# Patient Record
Sex: Female | Born: 1980 | Race: White | Hispanic: No | Marital: Married | Smoking: Never smoker
Health system: Southern US, Community
[De-identification: ages and names within clinical notes are randomized; demographics above are authoritative.]

## PROBLEM LIST (undated history)

## (undated) ENCOUNTER — Inpatient Hospital Stay (HOSPITAL_COMMUNITY): Payer: Self-pay

## (undated) DIAGNOSIS — D649 Anemia, unspecified: Secondary | ICD-10-CM

## (undated) DIAGNOSIS — D219 Benign neoplasm of connective and other soft tissue, unspecified: Secondary | ICD-10-CM

## (undated) DIAGNOSIS — Z8619 Personal history of other infectious and parasitic diseases: Secondary | ICD-10-CM

## (undated) DIAGNOSIS — R51 Headache: Secondary | ICD-10-CM

## (undated) DIAGNOSIS — R55 Syncope and collapse: Secondary | ICD-10-CM

## (undated) DIAGNOSIS — O039 Complete or unspecified spontaneous abortion without complication: Secondary | ICD-10-CM

## (undated) HISTORY — PX: ARTHROSCOPIC REPAIR ACL: SUR80

## (undated) HISTORY — DX: Anemia, unspecified: D64.9

## (undated) HISTORY — DX: Complete or unspecified spontaneous abortion without complication: O03.9

## (undated) HISTORY — DX: Headache: R51

## (undated) HISTORY — DX: Personal history of other infectious and parasitic diseases: Z86.19

## (undated) HISTORY — DX: Syncope and collapse: R55

## (undated) HISTORY — DX: Benign neoplasm of connective and other soft tissue, unspecified: D21.9

---
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
## 2003-12-08 HISTORY — PX: HAMMER TOE SURGERY: SHX385

## 2006-03-15 ENCOUNTER — Other Ambulatory Visit: Admission: RE | Admit: 2006-03-15 | Discharge: 2006-03-15 | Payer: Self-pay | Admitting: Obstetrics and Gynecology

## 2007-12-08 HISTORY — PX: KNEE SURGERY: SHX244

## 2007-12-08 HISTORY — PX: OTHER SURGICAL HISTORY: SHX169

## 2008-01-31 ENCOUNTER — Encounter: Admission: RE | Admit: 2008-01-31 | Discharge: 2008-01-31 | Payer: Self-pay | Admitting: Orthopedic Surgery

## 2008-02-23 ENCOUNTER — Encounter: Admission: RE | Admit: 2008-02-23 | Discharge: 2008-02-23 | Payer: Self-pay | Admitting: Orthopedic Surgery

## 2008-12-12 ENCOUNTER — Ambulatory Visit: Admission: AD | Admit: 2008-12-12 | Discharge: 2008-12-12 | Payer: Self-pay | Admitting: Obstetrics and Gynecology

## 2008-12-12 ENCOUNTER — Encounter (INDEPENDENT_AMBULATORY_CARE_PROVIDER_SITE_OTHER): Payer: Self-pay | Admitting: Obstetrics and Gynecology

## 2009-11-19 ENCOUNTER — Emergency Department (HOSPITAL_COMMUNITY): Admission: EM | Admit: 2009-11-19 | Discharge: 2009-11-19 | Payer: Self-pay | Admitting: Emergency Medicine

## 2010-10-16 ENCOUNTER — Inpatient Hospital Stay (HOSPITAL_COMMUNITY): Admission: AD | Admit: 2010-10-16 | Discharge: 2010-10-17 | Payer: Self-pay | Admitting: Obstetrics and Gynecology

## 2011-03-23 LAB — CBC
HCT: 23.8 % — ABNORMAL LOW (ref 36.0–46.0)
MCV: 58.4 fL — ABNORMAL LOW (ref 78.0–100.0)
Platelets: 381 10*3/uL (ref 150–400)
RDW: 20.4 % — ABNORMAL HIGH (ref 11.5–15.5)

## 2011-03-31 ENCOUNTER — Inpatient Hospital Stay (HOSPITAL_COMMUNITY)
Admission: AD | Admit: 2011-03-31 | Discharge: 2011-03-31 | Disposition: A | Discharge status: Home / Self Care | Payer: BC Managed Care – PPO | Source: Ambulatory Visit | Attending: Obstetrics and Gynecology | Admitting: Obstetrics and Gynecology

## 2011-03-31 DIAGNOSIS — O479 False labor, unspecified: Secondary | ICD-10-CM | POA: Insufficient documentation

## 2011-04-05 ENCOUNTER — Inpatient Hospital Stay (HOSPITAL_COMMUNITY): Admission: AD | Admit: 2011-04-05 | Payer: Self-pay | Admitting: *Deleted

## 2011-04-10 ENCOUNTER — Inpatient Hospital Stay (HOSPITAL_COMMUNITY)
Admission: RE | Admit: 2011-04-10 | Discharge: 2011-04-12 | DRG: 373 | Disposition: A | Discharge status: Home / Self Care | Payer: BC Managed Care – PPO | Source: Ambulatory Visit | Attending: Obstetrics and Gynecology | Admitting: Obstetrics and Gynecology

## 2011-04-10 DIAGNOSIS — O480 Post-term pregnancy: Principal | ICD-10-CM | POA: Diagnosis present

## 2011-04-10 LAB — CBC
MCHC: 33.9 g/dL (ref 30.0–36.0)
Platelets: 209 10*3/uL (ref 150–400)
RDW: 13.9 % (ref 11.5–15.5)
WBC: 7.8 10*3/uL (ref 4.0–10.5)

## 2011-04-11 LAB — CBC
Platelets: 218 10*3/uL (ref 150–400)
RDW: 13.9 % (ref 11.5–15.5)
WBC: 16.6 10*3/uL — ABNORMAL HIGH (ref 4.0–10.5)

## 2011-04-11 LAB — RPR: RPR Ser Ql: NONREACTIVE

## 2011-04-21 NOTE — Op Note (Signed)
Loretta Bates, Loretta Bates                ACCOUNT NO.:  [id number]   MEDICAL RECORD NO.:  [id number]          PATIENT TYPE:  AMB   LOCATION:  [facility]                          FACILITY:  [facility]   PHYSICIAN:  Michelle L. Grewal, M.D.DATE OF BIRTH:  1981/04/19   DATE OF PROCEDURE:  12/12/2008  DATE OF DISCHARGE:                               OPERATIVE REPORT   PREOPERATIVE DIAGNOSES:  1. Prolapsing fibroids.  2. Menorrhagia.  3. Anemia.   POSTOPERATIVE DIAGNOSES:  1. Prolapsing fibroids.  2. Menorrhagia.  3. Anemia.   PROCEDURE:  1. Removal of prolapsing myoma.  2. Uterine curettage.   SURGEON:  Michelle L. Vincente Poli, MD   ANESTHESIA:  Spinal.   FINDINGS:  A 4-cm prolapsing fibroid sent to Pathology.   ESTIMATED BLOOD LOSS:  Minimal.   COMPLICATIONS:  None.   PROCEDURE:  The patient was taken to the operating room.  Her spinal was  placed without incident.  She was prepped and draped in usual sterile  fashion.  Speculum was inserted to the vagina and a 4-cm fibroid was  noted prolapsing into the vagina.  I grasped it with a tenaculum it  twisted about 5 times and easily came off along with the stalk, it was  sent to Pathology.  I then inserted a sharp uterine curette since the  cervix was already dilated and thoroughly curetted the uterine cavity  and moderate amount of tissue.  I could not palpate any other fibroids  within the uterine cavity.  I then sent that to Pathology as well,  labeled the uterine curettings.  The vagina was cleaned out.  There was  no heavy bleeding noted.  All her instruments removed from the vagina.  The patient tolerated procedure well and she went to recovery room in  stable condition.     Michelle L. Vincente Poli, M.D.  Electronically Signed    MLG/MEDQ  D:  12/12/2008  T:  12/13/2008  Job:  [id number]

## 2011-12-08 DIAGNOSIS — O039 Complete or unspecified spontaneous abortion without complication: Secondary | ICD-10-CM

## 2011-12-08 HISTORY — DX: Complete or unspecified spontaneous abortion without complication: O03.9

## 2012-06-02 ENCOUNTER — Other Ambulatory Visit: Payer: Self-pay | Admitting: Gastroenterology

## 2012-06-02 DIAGNOSIS — R112 Nausea with vomiting, unspecified: Secondary | ICD-10-CM

## 2012-06-03 ENCOUNTER — Other Ambulatory Visit: Payer: BC Managed Care – PPO

## 2012-06-07 ENCOUNTER — Ambulatory Visit
Admission: RE | Admit: 2012-06-07 | Discharge: 2012-06-07 | Disposition: A | Discharge status: Home / Self Care | Payer: BC Managed Care – PPO | Source: Ambulatory Visit | Attending: Gastroenterology | Admitting: Gastroenterology

## 2012-06-07 DIAGNOSIS — R112 Nausea with vomiting, unspecified: Secondary | ICD-10-CM

## 2013-02-23 LAB — OB RESULTS CONSOLE HEPATITIS B SURFACE ANTIGEN: Hepatitis B Surface Ag: NEGATIVE

## 2013-02-23 LAB — OB RESULTS CONSOLE ANTIBODY SCREEN: Antibody Screen: NEGATIVE

## 2013-02-23 LAB — OB RESULTS CONSOLE GC/CHLAMYDIA
Chlamydia: NEGATIVE
Gonorrhea: NEGATIVE

## 2013-08-25 IMAGING — US US ABDOMEN COMPLETE
1 series · 14 of 25 positions shown · non-contrast
Comparison: None.

CLINICAL DATA: Nausea and vomiting

COMPLETE ABDOMINAL ULTRASOUND

[Series 1: us abdomen complete · 0.24mm/px · 14 of 81 slices shown]
[im 1/81]
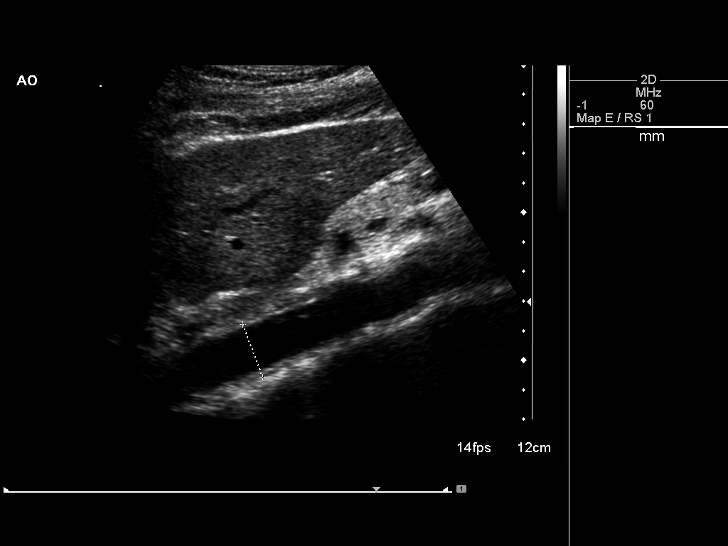
[im 7/81]
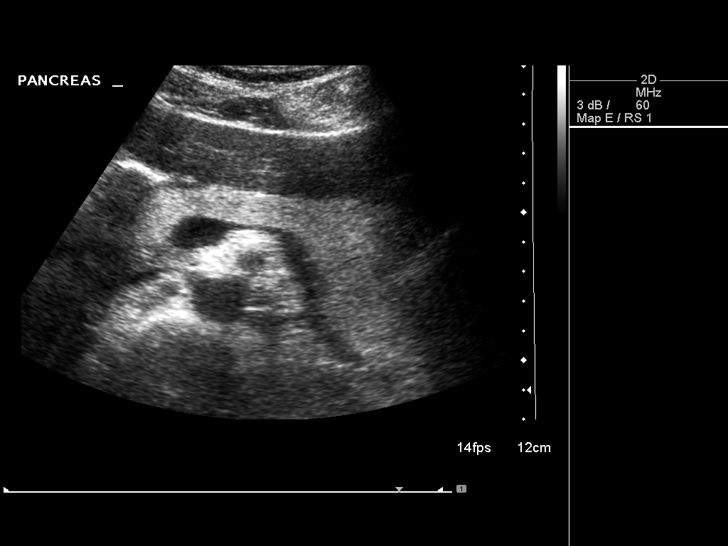
[im 14/81]
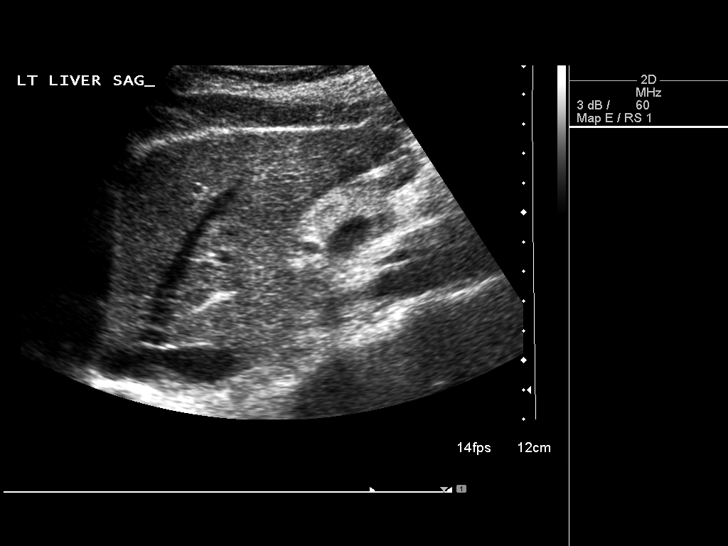
[im 21/81]
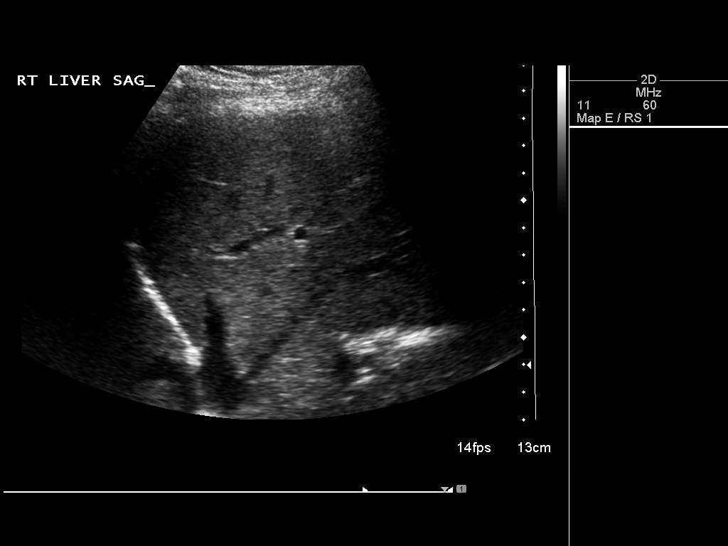
[im 27/81]
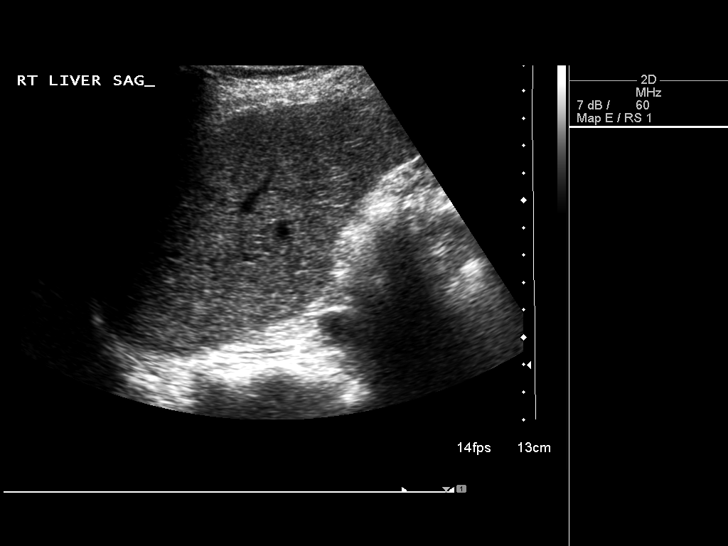
[im 31/81]
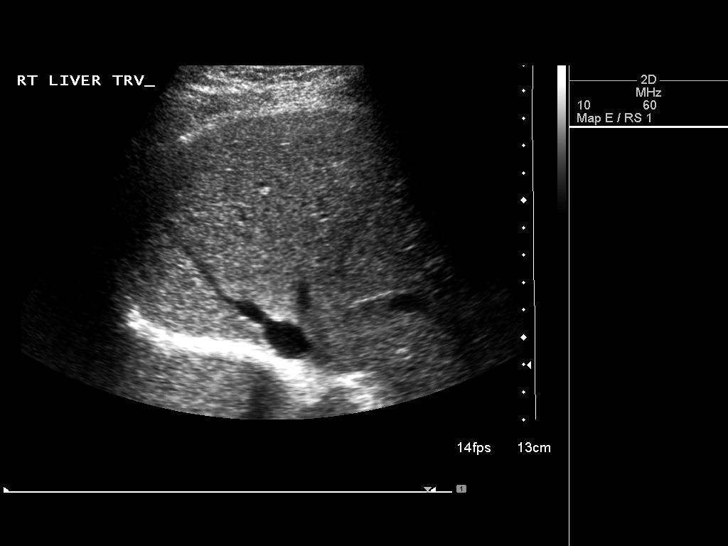
[im 37/81]
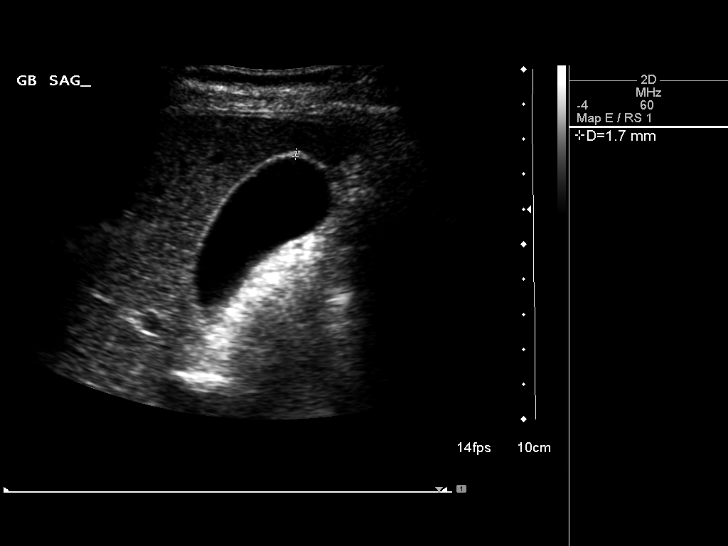
[im 44/81]
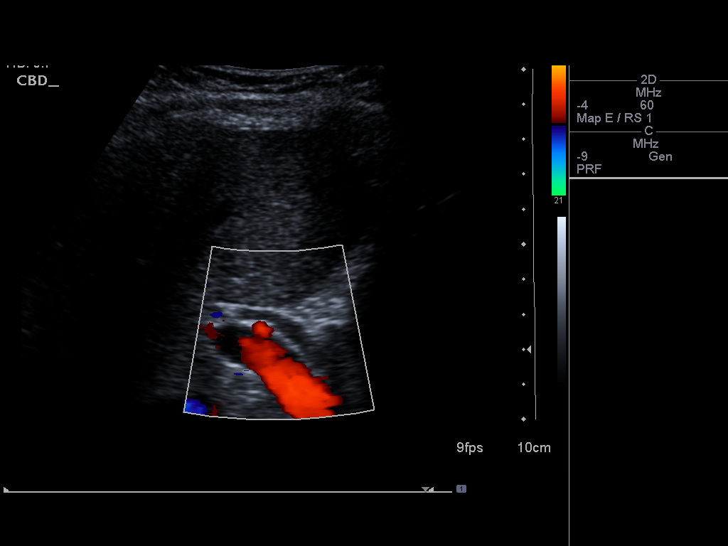
[im 51/81]
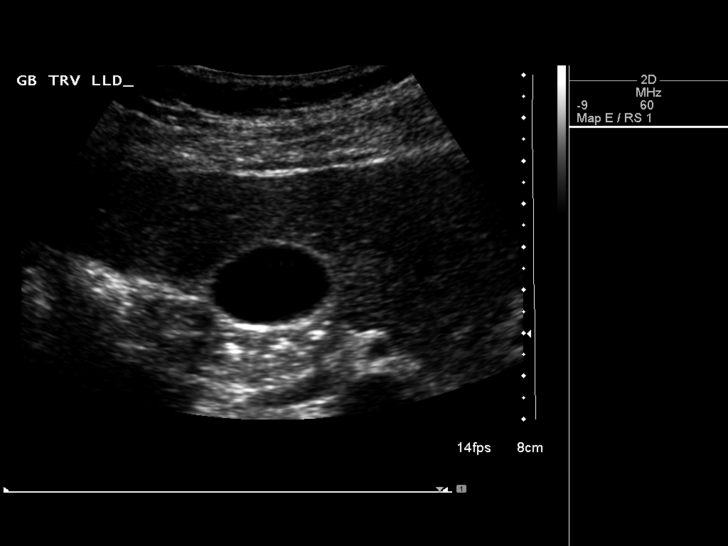
[im 54/81]
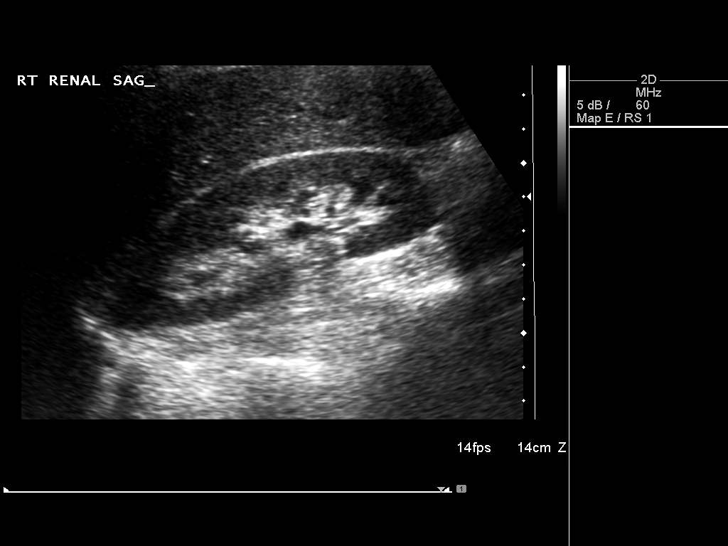
[im 61/81]
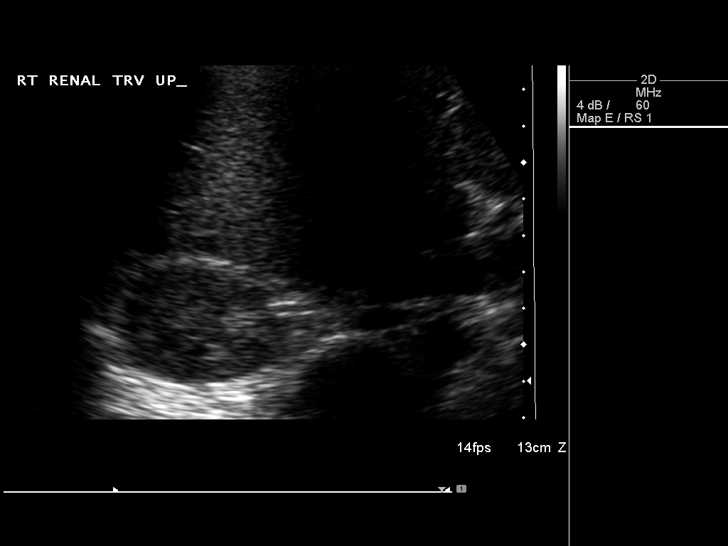
[im 67/81]
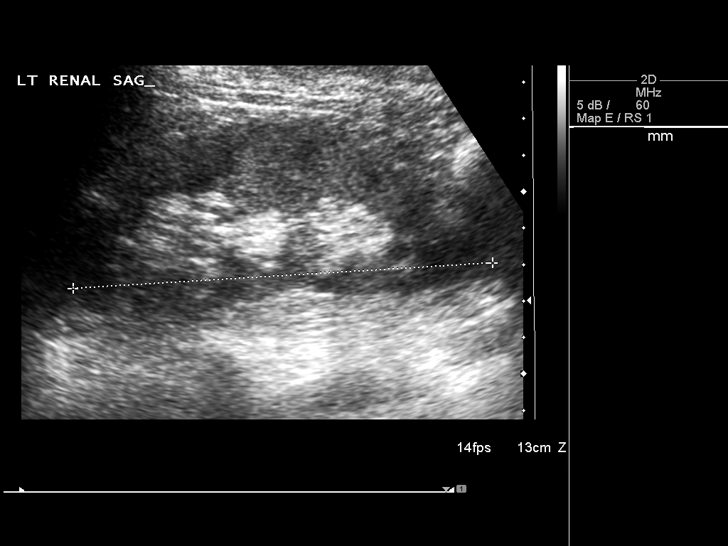
[im 74/81]
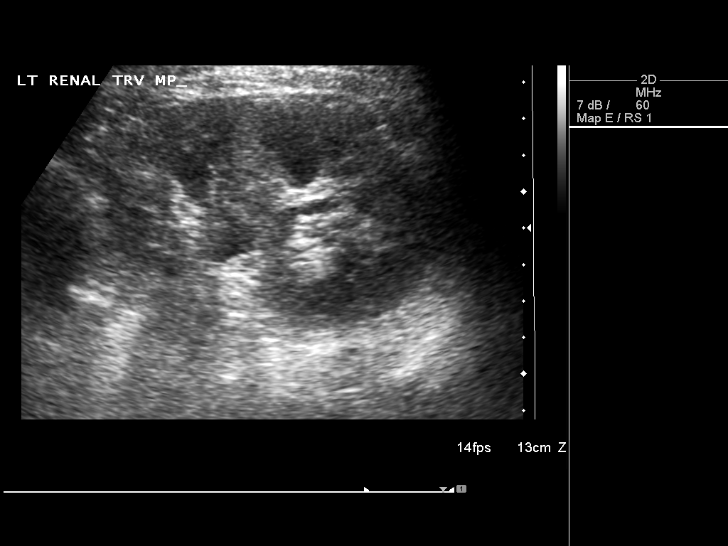
[im 81/81]
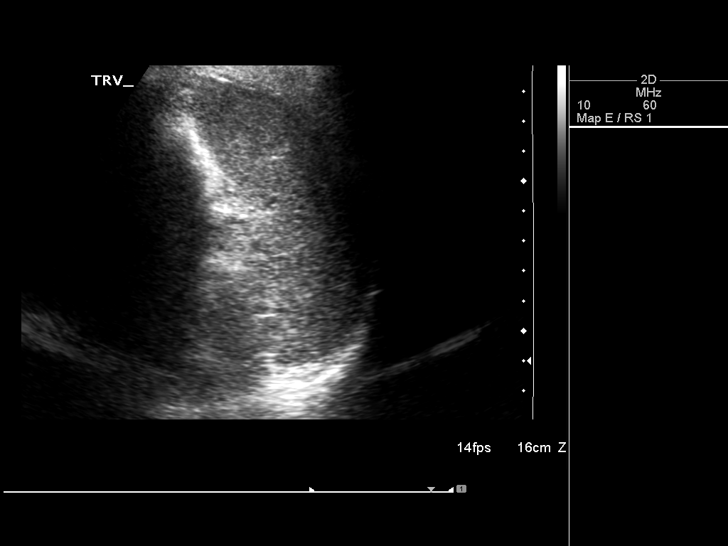

[14 of 25 positions shown; findings below may reference images not displayed]

FINDINGS: Gallbladder:  The gallbladder is visualized and no gallstones are
noted.  There is no pain over the gallbladder with compression.

Common bile duct:  The common bile duct does measure up to 7 mm in
diameter distally which is within upper limits of normal.
Correlation with liver function tests is recommended.

Liver:  The liver has a normal echogenic pattern.  No ductal
dilatation is seen.

IVC:  The IVC is partially obscured by bowel gas.

Pancreas:  No focal abnormality seen.

Spleen:  The spleen is normal measuring 8.7 cm sagittally.

Right Kidney:  No hydronephrosis is seen.  The right kidney
measures 11.0 cm sagittally.

Left Kidney:  No hydronephrosis is seen.  The left kidney measures
11.5 cm.

Abdominal aorta:  The abdominal aorta is normal in caliber.
IMPRESSION: 1.  No gallstones.
2.  Common bile duct is within upper limits of normal for age.
Correlate with LFTs.

## 2013-09-11 LAB — OB RESULTS CONSOLE GBS: GBS: POSITIVE

## 2013-09-28 ENCOUNTER — Telehealth (HOSPITAL_COMMUNITY): Payer: Self-pay | Admitting: *Deleted

## 2013-09-28 ENCOUNTER — Encounter (HOSPITAL_COMMUNITY): Payer: Self-pay | Admitting: *Deleted

## 2013-09-28 NOTE — Telephone Encounter (Signed)
Preadmission screen  

## 2013-10-05 ENCOUNTER — Inpatient Hospital Stay (HOSPITAL_COMMUNITY): Admission: AD | Admit: 2013-10-05 | Payer: Self-pay | Source: Ambulatory Visit | Admitting: Obstetrics and Gynecology

## 2013-10-09 ENCOUNTER — Inpatient Hospital Stay (HOSPITAL_COMMUNITY): Payer: BC Managed Care – PPO

## 2013-10-09 ENCOUNTER — Inpatient Hospital Stay (HOSPITAL_COMMUNITY)
Admission: RE | Admit: 2013-10-09 | Discharge: 2013-10-12 | DRG: 775 | Disposition: A | Discharge status: Home / Self Care | Payer: BC Managed Care – PPO | Source: Ambulatory Visit | Attending: Obstetrics and Gynecology | Admitting: Obstetrics and Gynecology

## 2013-10-09 ENCOUNTER — Encounter (HOSPITAL_COMMUNITY): Payer: Self-pay

## 2013-10-09 DIAGNOSIS — O480 Post-term pregnancy: Principal | ICD-10-CM | POA: Diagnosis present

## 2013-10-09 DIAGNOSIS — Z22330 Carrier of Group B streptococcus: Secondary | ICD-10-CM

## 2013-10-09 DIAGNOSIS — O99892 Other specified diseases and conditions complicating childbirth: Secondary | ICD-10-CM | POA: Diagnosis present

## 2013-10-09 LAB — TYPE AND SCREEN: ABO/RH(D): B POS

## 2013-10-09 LAB — CBC
HCT: 37.6 % (ref 36.0–46.0)
MCV: 83.4 fL (ref 78.0–100.0)
RBC: 4.51 MIL/uL (ref 3.87–5.11)
RDW: 13.1 % (ref 11.5–15.5)
WBC: 7.6 10*3/uL (ref 4.0–10.5)

## 2013-10-09 MED ORDER — ACETAMINOPHEN 325 MG PO TABS
650.0000 mg | ORAL_TABLET | ORAL | Status: DC | PRN
  Administered 2013-10-09 – 2013-10-10 (×2): 650 mg via ORAL
  Filled 2013-10-09 (×2): qty 2

## 2013-10-09 MED ORDER — IBUPROFEN 600 MG PO TABS: 600.0000 mg | ORAL_TABLET | Freq: Four times a day (QID) | ORAL | Status: DC | PRN

## 2013-10-09 MED ORDER — OXYCODONE-ACETAMINOPHEN 5-325 MG PO TABS: 1.0000 | ORAL_TABLET | ORAL | Status: DC | PRN

## 2013-10-09 MED ORDER — PENICILLIN G POTASSIUM 5000000 UNITS IJ SOLR
5.0000 10*6.[IU] | Freq: Once | INTRAVENOUS | Status: AC
  Administered 2013-10-10: 5 10*6.[IU] via INTRAVENOUS
  Filled 2013-10-09: qty 5

## 2013-10-09 MED ORDER — BUTORPHANOL TARTRATE 1 MG/ML IJ SOLN: 1.0000 mg | INTRAMUSCULAR | Status: DC | PRN

## 2013-10-09 MED ORDER — FLEET ENEMA 7-19 GM/118ML RE ENEM: 1.0000 | ENEMA | Freq: Every day | RECTAL | Status: DC | PRN

## 2013-10-09 MED ORDER — OXYTOCIN 40 UNITS IN LACTATED RINGERS INFUSION - SIMPLE MED: 62.5000 mL/h | INTRAVENOUS | Status: DC

## 2013-10-09 MED ORDER — ONDANSETRON HCL 4 MG/2ML IJ SOLN: 4.0000 mg | Freq: Four times a day (QID) | INTRAMUSCULAR | Status: DC | PRN

## 2013-10-09 MED ORDER — PENICILLIN G POTASSIUM 5000000 UNITS IJ SOLR
2.5000 10*6.[IU] | INTRAVENOUS | Status: DC
  Administered 2013-10-10: 2.5 10*6.[IU] via INTRAVENOUS
  Filled 2013-10-09 (×5): qty 2.5

## 2013-10-09 MED ORDER — LIDOCAINE HCL (PF) 1 % IJ SOLN
30.0000 mL | INTRAMUSCULAR | Status: DC | PRN
  Administered 2013-10-10: 30 mL via SUBCUTANEOUS
  Filled 2013-10-09 (×2): qty 30

## 2013-10-09 MED ORDER — TERBUTALINE SULFATE 1 MG/ML IJ SOLN: 0.2500 mg | Freq: Once | INTRAMUSCULAR | Status: AC | PRN

## 2013-10-09 MED ORDER — LACTATED RINGERS IV SOLN
INTRAVENOUS | Status: DC
  Administered 2013-10-09 – 2013-10-10 (×2): via INTRAVENOUS

## 2013-10-09 MED ORDER — OXYTOCIN 40 UNITS IN LACTATED RINGERS INFUSION - SIMPLE MED
1.0000 m[IU]/min | INTRAVENOUS | Status: DC
  Administered 2013-10-10: 2 m[IU]/min via INTRAVENOUS
  Filled 2013-10-09: qty 1000

## 2013-10-09 MED ORDER — LACTATED RINGERS IV SOLN: 500.0000 mL | INTRAVENOUS | Status: DC | PRN

## 2013-10-09 MED ORDER — MISOPROSTOL 25 MCG QUARTER TABLET
25.0000 ug | ORAL_TABLET | ORAL | Status: DC | PRN
  Administered 2013-10-09 – 2013-10-10 (×2): 25 ug via VAGINAL
  Filled 2013-10-09 (×2): qty 1
  Filled 2013-10-09: qty 0.25

## 2013-10-09 MED ORDER — ZOLPIDEM TARTRATE 5 MG PO TABS
5.0000 mg | ORAL_TABLET | Freq: Every evening | ORAL | Status: DC | PRN
  Administered 2013-10-09: 5 mg via ORAL
  Filled 2013-10-09: qty 1

## 2013-10-09 MED ORDER — OXYTOCIN BOLUS FROM INFUSION: 500.0000 mL | INTRAVENOUS | Status: DC

## 2013-10-09 MED ORDER — CITRIC ACID-SODIUM CITRATE 334-500 MG/5ML PO SOLN: 30.0000 mL | ORAL | Status: DC | PRN

## 2013-10-10 ENCOUNTER — Encounter (HOSPITAL_COMMUNITY): Payer: Self-pay

## 2013-10-10 LAB — RPR: RPR Ser Ql: NONREACTIVE

## 2013-10-10 MED ORDER — WITCH HAZEL-GLYCERIN EX PADS: 1.0000 "application " | MEDICATED_PAD | CUTANEOUS | Status: DC | PRN

## 2013-10-10 MED ORDER — MEASLES, MUMPS & RUBELLA VAC ~~LOC~~ INJ: 0.5000 mL | INJECTION | Freq: Once | SUBCUTANEOUS | Status: DC

## 2013-10-10 MED ORDER — SIMETHICONE 80 MG PO CHEW: 80.0000 mg | CHEWABLE_TABLET | ORAL | Status: DC | PRN

## 2013-10-10 MED ORDER — DIBUCAINE 1 % RE OINT: 1.0000 "application " | TOPICAL_OINTMENT | RECTAL | Status: DC | PRN

## 2013-10-10 MED ORDER — IBUPROFEN 600 MG PO TABS
600.0000 mg | ORAL_TABLET | Freq: Four times a day (QID) | ORAL | Status: DC
  Administered 2013-10-10 – 2013-10-12 (×7): 600 mg via ORAL
  Filled 2013-10-10 (×7): qty 1

## 2013-10-10 MED ORDER — BENZOCAINE-MENTHOL 20-0.5 % EX AERO
1.0000 "application " | INHALATION_SPRAY | CUTANEOUS | Status: DC | PRN
  Filled 2013-10-10: qty 56

## 2013-10-10 MED ORDER — DIPHENHYDRAMINE HCL 25 MG PO CAPS: 25.0000 mg | ORAL_CAPSULE | Freq: Four times a day (QID) | ORAL | Status: DC | PRN

## 2013-10-10 MED ORDER — MEDROXYPROGESTERONE ACETATE 150 MG/ML IM SUSP: 150.0000 mg | INTRAMUSCULAR | Status: DC | PRN

## 2013-10-10 MED ORDER — LANOLIN HYDROUS EX OINT: TOPICAL_OINTMENT | CUTANEOUS | Status: DC | PRN

## 2013-10-10 MED ORDER — EPHEDRINE 5 MG/ML INJ
10.0000 mg | INTRAVENOUS | Status: DC | PRN
  Filled 2013-10-10: qty 2

## 2013-10-10 MED ORDER — PRENATAL MULTIVITAMIN CH
1.0000 | ORAL_TABLET | Freq: Every day | ORAL | Status: DC
  Administered 2013-10-10 – 2013-10-11 (×2): 1 via ORAL
  Filled 2013-10-10 (×2): qty 1

## 2013-10-10 MED ORDER — LACTATED RINGERS IV SOLN: 500.0000 mL | Freq: Once | INTRAVENOUS | Status: DC

## 2013-10-10 MED ORDER — ONDANSETRON HCL 4 MG PO TABS: 4.0000 mg | ORAL_TABLET | ORAL | Status: DC | PRN

## 2013-10-10 MED ORDER — FENTANYL 2.5 MCG/ML BUPIVACAINE 1/10 % EPIDURAL INFUSION (WH - ANES): 14.0000 mL/h | INTRAMUSCULAR | Status: DC | PRN

## 2013-10-10 MED ORDER — PHENYLEPHRINE 40 MCG/ML (10ML) SYRINGE FOR IV PUSH (FOR BLOOD PRESSURE SUPPORT)
80.0000 ug | PREFILLED_SYRINGE | INTRAVENOUS | Status: DC | PRN
  Filled 2013-10-10: qty 2

## 2013-10-10 MED ORDER — DIPHENHYDRAMINE HCL 50 MG/ML IJ SOLN: 12.5000 mg | INTRAMUSCULAR | Status: DC | PRN

## 2013-10-10 MED ORDER — ONDANSETRON HCL 4 MG/2ML IJ SOLN: 4.0000 mg | INTRAMUSCULAR | Status: DC | PRN

## 2013-10-10 MED ORDER — TETANUS-DIPHTH-ACELL PERTUSSIS 5-2.5-18.5 LF-MCG/0.5 IM SUSP: 0.5000 mL | Freq: Once | INTRAMUSCULAR | Status: DC

## 2013-10-10 MED ORDER — SENNOSIDES-DOCUSATE SODIUM 8.6-50 MG PO TABS
2.0000 | ORAL_TABLET | ORAL | Status: DC
  Administered 2013-10-10 – 2013-10-12 (×2): 2 via ORAL
  Filled 2013-10-10 (×2): qty 2

## 2013-10-10 MED ORDER — OXYCODONE-ACETAMINOPHEN 5-325 MG PO TABS
1.0000 | ORAL_TABLET | ORAL | Status: DC | PRN
  Administered 2013-10-10 (×2): 2 via ORAL
  Administered 2013-10-11 – 2013-10-12 (×4): 1 via ORAL
  Administered 2013-10-12: 2 via ORAL
  Filled 2013-10-10: qty 2
  Filled 2013-10-10 (×2): qty 1
  Filled 2013-10-10 (×2): qty 2
  Filled 2013-10-10 (×2): qty 1

## 2013-10-10 NOTE — Progress Notes (Signed)
SVD of vigerous  female infant w/ apgars of 7,9.  Placenta delivered spontaneous w/ 3VC.   Lidocaine injected to perineum for local anesthesia 2nd degree lac repaired w/ 3-0 vicryl rapide.  Fundus firm.  EBL 475cc .

## 2013-10-10 NOTE — H&P (Signed)
Loretta Bates is a 32 y.o. female presenting for postdates IOL.  No regular ctx.  No vb or lof.  + FM.  Pregnancy uncomplicated.  History OB History   Grav Para Term Preterm Abortions TAB SAB Ect Mult Living   3 1 1  1  1   1      Past Medical History  Diagnosis Date  . Fibroid   . Hx of varicella   . Syncope     neurocardiac in HS and college, took meds no problems since then  . Anemia   . Headache(784.0)     hormonal migraines   Past Surgical History  Procedure Laterality Date  . Hammer toe surgery    . Arthroscopic repair acl    . Knee surgery    . Eye surgery    . Resection of prolapsed fribroid  2010   Family History: family history includes Cancer in her maternal grandfather, paternal grandfather, and paternal grandmother; Goiter in her mother and sister; Other in her father. Social History:  reports that she has never smoked. She has never used smokeless tobacco. She reports that she does not drink alcohol or use illicit drugs.   Prenatal Transfer Tool  Maternal Diabetes: No Genetic Screening: Normal Maternal Ultrasounds/Referrals: Normal Fetal Ultrasounds or other Referrals:  None Maternal Substance Abuse:  No Significant Maternal Medications:  None Significant Maternal Lab Results:  None Other Comments:  None  ROS  Dilation: 3 Effacement (%): 50 Station: -2 Exam by:: Sanela Evola Blood pressure 116/82, pulse 80, temperature 98.1 F (36.7 C), temperature source Oral, resp. rate 20, height 5\' 2"  (1.575 m), weight 75.297 kg (166 lb), last menstrual period 12/29/2012. Exam Physical Exam  Prenatal labs: ABO, Rh: --/--/B POS (11/03 2030) Antibody: NEG (11/03 2030) Rubella: Immune (03/20 0000) RPR: NON REACTIVE (11/03 2030)  HBsAg: Negative (03/20 0000)  HIV: Non-reactive (03/20 0000)  GBS: Positive (10/06 0000)   Assessment/Plan: Admit Pitocin Exp mngt Epidural prn GBS prophyla   Loretta Bates 10/10/2013, 8:20 AM

## 2013-10-11 LAB — CBC
HCT: 32.4 % — ABNORMAL LOW (ref 36.0–46.0)
Hemoglobin: 11.7 g/dL — ABNORMAL LOW (ref 12.0–15.0)
MCV: 84.4 fL (ref 78.0–100.0)
Platelets: 188 10*3/uL (ref 150–400)
RBC: 3.84 MIL/uL — ABNORMAL LOW (ref 3.87–5.11)
RDW: 13.4 % (ref 11.5–15.5)
WBC: 12.8 10*3/uL — ABNORMAL HIGH (ref 4.0–10.5)

## 2013-10-11 NOTE — Progress Notes (Signed)
Post Partum Day 1 Subjective: no complaints, up ad lib, voiding and tolerating PO  Objective: Blood pressure 93/65, pulse 73, temperature 97.6 F (36.4 C), temperature source Oral, resp. rate 18, height 5\' 2"  (1.575 m), weight 166 lb (75.297 kg), last menstrual period 12/29/2012, SpO2 97.00%, unknown if currently breastfeeding.  Physical Exam:  General: alert and cooperative Lochia: appropriate Uterine Fundus: firm Incision: perineum intact DVT Evaluation: No evidence of DVT seen on physical exam. Negative Homan's sign. No cords or calf tenderness. No significant calf/ankle edema.   Recent Labs  10/09/13 2030 10/11/13 0640  HGB 13.3 11.7*  HCT 37.6 32.4*    Assessment/Plan: Plan for discharge tomorrow   LOS: 2 days   CURTIS,CAROL G 10/11/2013, 8:12 AM

## 2013-10-12 MED ORDER — OXYCODONE-ACETAMINOPHEN 5-325 MG PO TABS: 1.0000 | ORAL_TABLET | ORAL | Status: DC | PRN

## 2013-10-12 MED ORDER — IBUPROFEN 600 MG PO TABS: 600.0000 mg | ORAL_TABLET | Freq: Four times a day (QID) | ORAL | Status: DC

## 2013-10-12 NOTE — Discharge Summary (Signed)
Obstetric Discharge Summary Reason for Admission: induction of labor Prenatal Procedures: ultrasound Intrapartum Procedures: spontaneous vaginal delivery Postpartum Procedures: none Complications-Operative and Postpartum: 2 degree perineal laceration Hemoglobin  Date Value Range Status  10/11/2013 11.7* 12.0 - 15.0 g/dL Final     HCT  Date Value Range Status  10/11/2013 32.4* 36.0 - 46.0 % Final    Physical Exam:  General: alert and cooperative Lochia: appropriate Uterine Fundus: firm Incision: perineum intact DVT Evaluation: No evidence of DVT seen on physical exam. Negative Homan's sign. No cords or calf tenderness. No significant calf/ankle edema.  Discharge Diagnoses: Term Pregnancy-delivered  Discharge Information: Date: 10/12/2013 Activity: pelvic rest Diet: routine Medications: PNV, Ibuprofen and Percocet Condition: stable Instructions: refer to practice specific booklet Discharge to: home   Newborn Data: Live born female  Birth Weight: 7 lb 6.3 oz (3355 g) APGAR: 8, 9  Home with mother.  Loretta Bates G 10/12/2013, 8:19 AM

## 2014-10-08 ENCOUNTER — Encounter (HOSPITAL_COMMUNITY): Payer: Self-pay

## 2014-12-07 NOTE — L&D Delivery Note (Signed)
SVD of VFI at 1733 on 12/06/15.  APGARs 8,9.  EBL 200cc.   Head delivered OA and body followed atraumatically.  Cord was clamped and cut.  Cord blood was obtained.  Placenta delivered S/I/3VC.  Fundus was firmed with pitocin and massage.  2nd degree lac was infiltrated with 20 cc 1% lidocaine with epi.  Using 3-0 Rapide, the 2nd degree was repaired in the normal fashion.  Mom and baby stable.  Linda Hedges, DO

## 2015-01-18 ENCOUNTER — Other Ambulatory Visit: Payer: Self-pay | Admitting: Obstetrics and Gynecology

## 2015-01-22 LAB — CYTOLOGY - PAP

## 2015-04-23 LAB — OB RESULTS CONSOLE ABO/RH: RH Type: POSITIVE

## 2015-04-23 LAB — OB RESULTS CONSOLE RPR: RPR: NONREACTIVE

## 2015-04-23 LAB — OB RESULTS CONSOLE HIV ANTIBODY (ROUTINE TESTING): HIV: NONREACTIVE

## 2015-04-23 LAB — OB RESULTS CONSOLE GC/CHLAMYDIA
Chlamydia: NEGATIVE
Gonorrhea: NEGATIVE

## 2015-04-23 LAB — OB RESULTS CONSOLE ANTIBODY SCREEN: Antibody Screen: NEGATIVE

## 2015-04-23 LAB — OB RESULTS CONSOLE RUBELLA ANTIBODY, IGM: RUBELLA: IMMUNE

## 2015-04-23 LAB — OB RESULTS CONSOLE HEPATITIS B SURFACE ANTIGEN: Hepatitis B Surface Ag: NEGATIVE

## 2015-06-03 ENCOUNTER — Inpatient Hospital Stay (HOSPITAL_COMMUNITY)
Admission: AD | Admit: 2015-06-03 | Discharge: 2015-06-03 | Disposition: A | Discharge status: Home / Self Care | Payer: BC Managed Care – PPO | Source: Ambulatory Visit | Attending: Obstetrics and Gynecology | Admitting: Obstetrics and Gynecology

## 2015-06-03 ENCOUNTER — Encounter (HOSPITAL_COMMUNITY): Payer: Self-pay

## 2015-06-03 DIAGNOSIS — G43019 Migraine without aura, intractable, without status migrainosus: Secondary | ICD-10-CM

## 2015-06-03 DIAGNOSIS — Z3A13 13 weeks gestation of pregnancy: Secondary | ICD-10-CM | POA: Insufficient documentation

## 2015-06-03 DIAGNOSIS — G43909 Migraine, unspecified, not intractable, without status migrainosus: Secondary | ICD-10-CM | POA: Insufficient documentation

## 2015-06-03 DIAGNOSIS — O9989 Other specified diseases and conditions complicating pregnancy, childbirth and the puerperium: Secondary | ICD-10-CM | POA: Diagnosis not present

## 2015-06-03 LAB — URINALYSIS, ROUTINE W REFLEX MICROSCOPIC
BILIRUBIN URINE: NEGATIVE
Glucose, UA: NEGATIVE mg/dL
HGB URINE DIPSTICK: NEGATIVE
Ketones, ur: NEGATIVE mg/dL
Leukocytes, UA: NEGATIVE
Nitrite: NEGATIVE
Protein, ur: NEGATIVE mg/dL
Specific Gravity, Urine: 1.015 (ref 1.005–1.030)
Urobilinogen, UA: 0.2 mg/dL (ref 0.0–1.0)
pH: 6.5 (ref 5.0–8.0)

## 2015-06-03 MED ORDER — DIPHENHYDRAMINE HCL 50 MG/ML IJ SOLN
25.0000 mg | Freq: Once | INTRAMUSCULAR | Status: AC
  Administered 2015-06-03: 25 mg via INTRAVENOUS
  Filled 2015-06-03: qty 1

## 2015-06-03 MED ORDER — PROMETHAZINE HCL 25 MG PO TABS: 25.0000 mg | ORAL_TABLET | Freq: Four times a day (QID) | ORAL | Status: DC | PRN

## 2015-06-03 MED ORDER — DEXAMETHASONE SODIUM PHOSPHATE 10 MG/ML IJ SOLN
10.0000 mg | Freq: Once | INTRAMUSCULAR | Status: AC
  Administered 2015-06-03: 10 mg via INTRAVENOUS
  Filled 2015-06-03: qty 1

## 2015-06-03 MED ORDER — LACTATED RINGERS IV BOLUS (SEPSIS)
1000.0000 mL | Freq: Once | INTRAVENOUS | Status: AC
  Administered 2015-06-03: 1000 mL via INTRAVENOUS

## 2015-06-03 MED ORDER — METOCLOPRAMIDE HCL 5 MG/ML IJ SOLN
10.0000 mg | Freq: Once | INTRAMUSCULAR | Status: AC
  Administered 2015-06-03: 10 mg via INTRAVENOUS
  Filled 2015-06-03: qty 2

## 2015-06-03 NOTE — Discharge Instructions (Signed)

## 2015-06-03 NOTE — MAU Note (Signed)
Pt presents complaining of a migraine. States she gets them in pregnancy but these are worse than before. States it started on Wednesday and went away but it came back. Was given rx of vicodin and states it helps "off and on" and she doesn't want to take too much. Feels nauseous from the pain and is not eating or drinking.

## 2015-06-03 NOTE — MAU Provider Note (Signed)
History     CSN: [id number]  Arrival date and time: 06/03/15 1153   None     Chief Complaint  Patient presents with  . Headache   HPI   [name] is a 34 y.o. female [phone] at [redacted]w[redacted]d presenting to MAU with complaints of a migraine. She has a history of migraines, however they were easily managed in the past. She had a piece of chocolate cake Wednesday evening and woke up Thursday morning with a migraine; she feels chocolate may have been a trigger, however chocolate has never been a trigger in the past. She was given Vicodin for her migraines in this pregnancy, and feels that it has helped off and on.   This is very similar to migraines she has had in the past with migraines and pregnancy.   OB History    Gravida Para Term Preterm AB TAB SAB Ectopic Multiple Living   4 2 2  1  1   2       Past Medical History  Diagnosis Date  . Fibroid   . Hx of varicella   . Syncope     neurocardiac in HS and college, took meds no problems since then  . Anemia   . Headache(784.0)     hormonal migraines    Past Surgical History  Procedure Laterality Date  . Hammer toe surgery    . Arthroscopic repair acl    . Knee surgery    . Eye surgery    . Resection of prolapsed fribroid  2010    Family History  Problem Relation Age of Onset  . Goiter Mother   . Other Father     WPW syndrome wolf parkinson white syndrome  . Goiter Sister   . Cancer Maternal Grandfather     merkel cell carcinoma  . Cancer Paternal Grandmother     melanoma  . Cancer Paternal Grandfather     colon, prostate    History  Substance Use Topics  . Smoking status: Never Smoker   . Smokeless tobacco: Never Used  . Alcohol Use: No    Allergies: No Known Allergies  Prescriptions prior to admission  Medication Sig Dispense Refill Last Dose  . acetaminophen (TYLENOL) 325 MG tablet Take 650 mg by mouth every 6 (six) hours as needed for pain (For headache.).   10/09/2013 at Unknown time  . ibuprofen  (ADVIL,MOTRIN) 600 MG tablet Take 1 tablet (600 mg total) by mouth every 6 (six) hours. 30 tablet 1   . oxyCODONE-acetaminophen (PERCOCET/ROXICET) 5-325 MG per tablet Take 1-2 tablets by mouth every 4 (four) hours as needed for severe pain (moderate - severe pain). 30 tablet 0   . Prenatal Vit-Fe Fumarate-FA (PRENATAL MULTIVITAMIN) TABS tablet Take 1 tablet by mouth daily at 12 noon.   10/09/2013 at Unknown time  . Probiotic Product (PROBIOTIC DAILY PO) Take 1 capsule by mouth daily.   10/09/2013 at Unknown time   Results for orders placed or performed during the hospital encounter of 06/03/15 (from the past 48 hour(s))  Urinalysis, Routine w reflex microscopic (not at [facility])     Status: Abnormal   Collection Time: 06/03/15 12:00 PM  Result Value Ref Range   Color, Urine YELLOW YELLOW   APPearance HAZY (A) CLEAR   Specific Gravity, Urine 1.015 1.005 - 1.030   pH 6.5 5.0 - 8.0   Glucose, UA NEGATIVE NEGATIVE mg/dL   Hgb urine dipstick NEGATIVE NEGATIVE   Bilirubin Urine NEGATIVE NEGATIVE   Ketones,  ur NEGATIVE NEGATIVE mg/dL   Protein, ur NEGATIVE NEGATIVE mg/dL   Urobilinogen, UA 0.2 0.0 - 1.0 mg/dL   Nitrite NEGATIVE NEGATIVE   Leukocytes, UA NEGATIVE NEGATIVE    Comment: MICROSCOPIC NOT DONE ON URINES WITH NEGATIVE PROTEIN, BLOOD, LEUKOCYTES, NITRITE, OR GLUCOSE <1000 mg/dL.    Review of Systems  Eyes: Negative for blurred vision.  Gastrointestinal: Positive for nausea and vomiting.  Neurological: Positive for dizziness and headaches.   Physical Exam   Blood pressure 118/69, pulse 75, temperature 98.6 F (37 C), temperature source Oral, resp. rate 18, height 5\' 2"  (1.575 m), weight 69.037 kg (152 lb 3.2 oz), unknown if currently breastfeeding.  Physical Exam  Constitutional: She is oriented to person, place, and time. She appears well-developed and well-nourished. No distress.  HENT:  Head: Normocephalic.  Eyes: Pupils are equal, round, and reactive to light.  Neck: Neck supple.   Cardiovascular: Normal rate and normal heart sounds.   Respiratory: Effort normal and breath sounds normal.  GI: Soft.  Musculoskeletal: Normal range of motion.  Neurological: She is alert and oriented to person, place, and time.  Skin: Skin is warm. She is not diaphoretic.  Psychiatric: Her behavior is normal.    MAU Course  Procedures  None  MDM  Reglan 10 mg IV Decadron 10 mg IV Benadryl 25 mg IV  LR bolus  Patient rates her migraine pain 2/10 down from 6/10.   Discussed patient with Dr. Julien Girt    Assessment and Plan   A:  Migraine headache  P:  Discharge home in stable condition RX: Phenergan  Follow up with Dr. Julien Girt as scheduled Small, frequent meals.     Lezlie Lye, NP 06/03/2015 1:04 PM

## 2015-06-19 ENCOUNTER — Inpatient Hospital Stay (HOSPITAL_COMMUNITY)
Admission: AD | Admit: 2015-06-19 | Discharge: 2015-06-20 | Disposition: A | Discharge status: Home / Self Care | Payer: BC Managed Care – PPO | Source: Ambulatory Visit | Attending: Obstetrics and Gynecology | Admitting: Obstetrics and Gynecology

## 2015-06-19 DIAGNOSIS — O9989 Other specified diseases and conditions complicating pregnancy, childbirth and the puerperium: Secondary | ICD-10-CM | POA: Insufficient documentation

## 2015-06-19 DIAGNOSIS — Z3A15 15 weeks gestation of pregnancy: Secondary | ICD-10-CM | POA: Insufficient documentation

## 2015-06-19 DIAGNOSIS — G43909 Migraine, unspecified, not intractable, without status migrainosus: Secondary | ICD-10-CM | POA: Insufficient documentation

## 2015-06-19 DIAGNOSIS — G43709 Chronic migraine without aura, not intractable, without status migrainosus: Secondary | ICD-10-CM | POA: Diagnosis not present

## 2015-06-19 LAB — URINALYSIS, ROUTINE W REFLEX MICROSCOPIC
Bilirubin Urine: NEGATIVE
Glucose, UA: NEGATIVE mg/dL
Hgb urine dipstick: NEGATIVE
KETONES UR: NEGATIVE mg/dL
LEUKOCYTES UA: NEGATIVE
NITRITE: NEGATIVE
PH: 6 (ref 5.0–8.0)
Protein, ur: NEGATIVE mg/dL
Specific Gravity, Urine: <1.005 — ABNORMAL LOW (ref 1.005–1.030)
Urobilinogen, UA: 0.2 mg/dL (ref 0.0–1.0)

## 2015-06-19 MED ORDER — PROMETHAZINE HCL 25 MG/ML IJ SOLN
25.0000 mg | Freq: Once | INTRAMUSCULAR | Status: AC
  Administered 2015-06-19: 25 mg via INTRAVENOUS
  Filled 2015-06-19: qty 1

## 2015-06-19 MED ORDER — SODIUM CHLORIDE 0.9 % IV BOLUS (SEPSIS)
1000.0000 mL | Freq: Once | INTRAVENOUS | Status: AC
  Administered 2015-06-19: 1000 mL via INTRAVENOUS

## 2015-06-19 MED ORDER — DIPHENHYDRAMINE HCL 50 MG/ML IJ SOLN
25.0000 mg | Freq: Once | INTRAMUSCULAR | Status: AC
  Administered 2015-06-19: 25 mg via INTRAVENOUS
  Filled 2015-06-19: qty 1

## 2015-06-19 MED ORDER — DEXAMETHASONE SODIUM PHOSPHATE 10 MG/ML IJ SOLN
10.0000 mg | Freq: Once | INTRAMUSCULAR | Status: AC
  Administered 2015-06-19: 10 mg via INTRAVENOUS
  Filled 2015-06-19: qty 1

## 2015-06-19 NOTE — MAU Note (Signed)
Pt states she was seen here 3 weeks ago, has had migraines since positive preg test. Awakened with a migraine today and took a vicodin but doesn't think it helping with her headaches. Has had some vomiting today also.

## 2015-06-19 NOTE — MAU Provider Note (Signed)
History     CSN: [id number]  Arrival date and time: 06/19/15 2137   First Provider Initiated Contact with Patient 06/19/15 2222      No chief complaint on file.  HPI Comments: Loretta Bates is a 34 y.o. [id number] at [redacted]w[redacted]d who presents today with a migraine headache. She states that she has been dealing with these all during her pregnancy. She has been taking phenergan and vicodin, but states that they are not helping. She has vomiting several times today. She denies any obstetrical complaints today, no vaginal bleeding, LOF or abdominal pain. She has felt some occasional fetal movement.   Headache  This is a recurrent problem. The current episode started today. The problem occurs constantly. The problem has been unchanged. The pain is located in the frontal region. The pain does not radiate. The pain quality is similar to prior headaches. The quality of the pain is described as throbbing. The pain is at a severity of 9/10. Associated symptoms include nausea and vomiting. Pertinent negatives include no abdominal pain or fever. The symptoms are aggravated by hunger and activity. She has tried oral narcotics and darkened room (phenergan ) for the symptoms. The treatment provided no relief.    Past Medical History  Diagnosis Date  . Fibroid   . Hx of varicella   . Syncope     neurocardiac in HS and college, took meds no problems since then  . Anemia   . Headache(784.0)     hormonal migraines    Past Surgical History  Procedure Laterality Date  . Hammer toe surgery    . Arthroscopic repair acl    . Knee surgery    . Eye surgery    . Resection of prolapsed fribroid  2010    Family History  Problem Relation Age of Onset  . Goiter Mother   . Other Father     WPW syndrome wolf parkinson white syndrome  . Goiter Sister   . Cancer Maternal Grandfather     merkel cell carcinoma  . Cancer Paternal Grandmother     melanoma  . Cancer Paternal Grandfather     colon, prostate     History  Substance Use Topics  . Smoking status: Never Smoker   . Smokeless tobacco: Never Used  . Alcohol Use: No    Allergies: No Known Allergies  Prescriptions prior to admission  Medication Sig Dispense Refill Last Dose  . HYDROcodone-acetaminophen (NORCO/VICODIN) 5-325 MG per tablet Take 1 tablet by mouth every 6 (six) hours as needed for moderate pain.   06/19/2015 at 1500  . promethazine (PHENERGAN) 25 MG tablet Take 1 tablet (25 mg total) by mouth every 6 (six) hours as needed for nausea or vomiting. 30 tablet 0 06/19/2015 at 1500  . acetaminophen (TYLENOL) 325 MG tablet Take 650 mg by mouth every 6 (six) hours as needed for pain (For headache.).   Past Week at Unknown time  . Prenatal Vit-Fe Fumarate-FA (PRENATAL MULTIVITAMIN) TABS tablet Take 1 tablet by mouth daily at 12 noon.   Past Week at Unknown time    Review of Systems  Constitutional: Negative for fever.  Gastrointestinal: Positive for nausea and vomiting. Negative for abdominal pain and diarrhea.  Genitourinary: Negative for dysuria, urgency and frequency.  Neurological: Positive for headaches.   Physical Exam   Blood pressure 108/58, pulse 83, temperature 98.3 F (36.8 C), temperature source Oral, resp. rate 18, height 5\' 2"  (1.575 m), weight 69.854 kg (154 lb), SpO2 100 %, unknown  if currently breastfeeding.  Physical Exam  Nursing note and vitals reviewed. Constitutional: She is oriented to person, place, and time. She appears well-developed and well-nourished. No distress.  HENT:  Head: Normocephalic.  Cardiovascular: Normal rate.   Respiratory: Effort normal.  GI: Soft.  Neurological: She is alert and oriented to person, place, and time.  Skin: Skin is warm and dry.  Psychiatric: She has a normal mood and affect.    MAU Course  Procedures  MDM 0000: Patient reports feeling better. She has had 1L NS, phenergan, benadryl and decadron Pain is now about 4/10. 0028: D/W Dr. Gaetano Net, ok for DC home.  Needs to call the office in the morning to obtain an neuro consult.    Assessment and Plan   1. Chronic migraine without aura without status migrainosus, not intractable     DC home Comfort measures reviewed  2nd Trimester precautions  RX: continue current medications  Return to MAU as needed FU with OB as planned  Follow-up Information    Follow up with [facility] Marjean Donna, MD.   Specialty:  Obstetrics and Gynecology   Why:  Calll the office in the morning to get a referall to neurologist    Contact information:   [place] [address] [phone]         Mathis Bud 06/19/2015, 10:23 PM

## 2015-06-20 DIAGNOSIS — G43709 Chronic migraine without aura, not intractable, without status migrainosus: Secondary | ICD-10-CM | POA: Diagnosis not present

## 2015-06-20 NOTE — Discharge Instructions (Signed)

## 2015-07-15 ENCOUNTER — Encounter: Payer: Self-pay | Admitting: *Deleted

## 2015-07-16 ENCOUNTER — Ambulatory Visit (INDEPENDENT_AMBULATORY_CARE_PROVIDER_SITE_OTHER): Payer: BC Managed Care – PPO | Admitting: Diagnostic Neuroimaging

## 2015-07-16 ENCOUNTER — Encounter: Payer: Self-pay | Admitting: Diagnostic Neuroimaging

## 2015-07-16 VITALS — BP 102/70 | HR 90 | Ht 62.5 in | Wt 159.6 lb

## 2015-07-16 DIAGNOSIS — G43109 Migraine with aura, not intractable, without status migrainosus: Secondary | ICD-10-CM | POA: Diagnosis not present

## 2015-07-16 DIAGNOSIS — Z8669 Personal history of other diseases of the nervous system and sense organs: Secondary | ICD-10-CM

## 2015-07-16 DIAGNOSIS — Z8759 Personal history of other complications of pregnancy, childbirth and the puerperium: Secondary | ICD-10-CM

## 2015-07-16 NOTE — Patient Instructions (Signed)
-   continue acetaminophen prn (maximum 3000mg  daily) - consider magnesium supplement 400mg  daily +/- riboflavin 400mg  daily - limit midrin usage; consider fioricet instead for moderate-severe migraine

## 2015-07-16 NOTE — Progress Notes (Signed)
[facility]  PATIENT: Loretta Bates DOB: July 18, 1981  REFERRING CLINICIAN: Julien Girt HISTORY FROM: patient  REASON FOR VISIT: new consult    HISTORICAL  CHIEF COMPLAINT:  Chief Complaint  Patient presents with  . Migraine    rm 7, New patient    HISTORY OF PRESENT ILLNESS:   34 year old right-handed female, currently [redacted] weeks pregnant, here for evaluation of headaches.  Patient has history of migraine headaches since college, which she describes as bitemporal, throbbing, pressure headaches with photophobia and phonophobia. Certain foods such as chocolate and weather changes seem to trigger headaches. Patient has some warning sensation which she calls a "aura" but does not have any vision or numbness changes before headache. Patient was tried on topiramate and other prophylactic medications, possibly propranolol and verapamil. She went to [facility] as well as [facility] for evaluation of the past. Patient also tried Imitrex which helped in the past.  After getting married, patient and husband were planning to start a family and patient stopped her prophylactic headache medications. Patient did well. In 2012 she was pregnant and during first trimester had some headaches which were managed with Tylenol. In 2014 she was pregnant again and during first trimester had similar headaches which were manageable with Tylenol.  With current headache and pregnancy headaches are similar but somewhat more severe and more frequent than other 2 pregnancies. Also patient having more nausea and vomiting with these headaches. Patient was having 2 headaches per week in July which have decreased in month of August. Patient has tried Tylenol, Vicodin, Phenergan and more recently Midrin with mild relief.  Triggering factors otherwise in the current pregnancy could include difficulty with keeping up her fluid and food intake. She seems to be sleeping well.    REVIEW  OF SYSTEMS: Full 14 system review of systems performed and notable only for headache.  ALLERGIES: No Known Allergies  HOME MEDICATIONS: Outpatient Prescriptions Prior to Visit  Medication Sig Dispense Refill  . acetaminophen (TYLENOL) 325 MG tablet Take 650 mg by mouth every 6 (six) hours as needed for pain (For headache.).    Marland Kitchen Prenatal Vit-Fe Fumarate-FA (PRENATAL MULTIVITAMIN) TABS tablet Take 1 tablet by mouth daily at 12 noon.    . promethazine (PHENERGAN) 25 MG tablet Take 1 tablet (25 mg total) by mouth every 6 (six) hours as needed for nausea or vomiting. 30 tablet 0  . HYDROcodone-acetaminophen (NORCO/VICODIN) 5-325 MG per tablet Take 1 tablet by mouth every 6 (six) hours as needed for moderate pain.     No facility-administered medications prior to visit.    PAST MEDICAL HISTORY: Past Medical History  Diagnosis Date  . Fibroid   . Hx of varicella   . Syncope     neurocardiac in HS and college, took meds no problems since then  . Anemia     hx of  . Headache(784.0)     hormonal migraines  . Miscarriage 2013    PAST SURGICAL HISTORY: Past Surgical History  Procedure Laterality Date  . Hammer toe surgery  2005  . Arthroscopic repair acl    . Knee surgery  2009    ACL repair  . Eye surgery  1992  . Resection of prolapsed fribroid  2009  . Migraines      hx of, hormonal  . Vaginal delivery  2012, 2014    FAMILY HISTORY: Family History  Problem Relation Age of Onset  . Goiter Mother   . Other Father  WPW syndrome wolf parkinson white syndrome  . Goiter Sister   . Cancer Maternal Grandfather     merkel cell carcinoma  . Cancer Paternal Grandmother     melanoma  . Cancer Paternal Grandfather     colon, prostate  . Bipolar disorder Maternal Aunt     SOCIAL HISTORY:  History   Social History  . Marital Status: Married    Spouse Name: Aaron Edelman  . Number of Children: 2  . Years of Education: 16   Occupational History  .      stay at home Mom    Social History Main Topics  . Smoking status: Never Smoker   . Smokeless tobacco: Never Used  . Alcohol Use: No  . Drug Use: No  . Sexual Activity: Yes    Birth Control/ Protection: None   Other Topics Concern  . Not on file   Social History Narrative   Married, lives at home with husband, 2 children   Caffeine use- 1 cup coffee a day, occas soda     PHYSICAL EXAM  GENERAL EXAM/CONSTITUTIONAL: Vitals:  Filed Vitals:   07/16/15 0905  BP: 102/70  Pulse: 90  Height: 5' 2.5" (1.588 m)  Weight: 159 lb 9.6 oz (72.394 kg)     Body mass index is 28.71 kg/(m^2).  Visual Acuity Screening   Right eye Left eye Both eyes  Without correction:     With correction: 20/20 20/30   Comments: With contacts in place    Patient is in no distress; well developed, nourished and groomed; neck is supple  CARDIOVASCULAR:  Examination of carotid arteries is normal; no carotid bruits  Regular rate and rhythm, no murmurs  Examination of peripheral vascular system by observation and palpation is normal  EYES:  Ophthalmoscopic exam of optic discs and posterior segments is normal; no papilledema or hemorrhages  MUSCULOSKELETAL:  Gait, strength, tone, movements noted in Neurologic exam below  NEUROLOGIC: MENTAL STATUS:  No flowsheet data found.  awake, alert, oriented to person, place and time  recent and remote memory intact  normal attention and concentration  language fluent, comprehension intact, naming intact,   fund of knowledge appropriate  CRANIAL NERVE:   2nd - no papilledema on fundoscopic exam  2nd, 3rd, 4th, 6th - pupils equal and reactive to light, visual fields full to confrontation, extraocular muscles intact, no nystagmus  5th - facial sensation symmetric  7th - facial strength symmetric  8th - hearing intact  9th - palate elevates symmetrically, uvula midline  11th - shoulder shrug symmetric  12th - tongue protrusion midline  MOTOR:    normal bulk and tone, full strength in the BUE, BLE  SENSORY:   normal and symmetric to light touch, pinprick, temperature, vibration  COORDINATION:   finger-nose-finger, fine finger movements normal  REFLEXES:   deep tendon reflexes present and symmetric  GAIT/STATION:   narrow based gait; able to walk on toes, heels and tandem; romberg is negative    DIAGNOSTIC DATA (LABS, IMAGING, TESTING) - I reviewed patient records, labs, notes, testing and imaging myself where available.  Lab Results  Component Value Date   WBC 12.8* 10/11/2013   HGB 11.7* 10/11/2013   HCT 32.4* 10/11/2013   MCV 84.4 10/11/2013   PLT 188 10/11/2013   No results found for: NA, K, CL, CO2, GLUCOSE, BUN, CREATININE, CALCIUM, PROT, ALBUMIN, AST, ALT, ALKPHOS, BILITOT, GFRNONAA, GFRAA No results found for: CHOL, HDL, LDLCALC, LDLDIRECT, TRIG, CHOLHDL No results found for: HGBA1C No  results found for: VITAMINB12 No results found for: TSH     ASSESSMENT AND PLAN  34 y.o. year old female here with migraine headaches, now with increasing headaches during pregnancy. Most likely represents prolonged migraine / transformed migraine. Neurologic examination is unremarkable. No need for neuroimaging study at this time.   The challenge remains in pregnancy with medication management and balancing safety versus benefit. Advised patient to continue Tylenol as needed for pain. May try daily magnesium or riboflavin supplement for migraine prevention. If headaches are much more severe could consider Tylenol combination therapy such as hydrocodone/oxycodone/codeine or fioricet in limited amounts. Finally holistic headache management with focus on hydration, nutrition, mild physical activity, sleep and stress management usually the most safe and effective treatments during this time.   Dx:  Migraine with aura and without status migrainosus, not intractable  Hx of migraine during pregnancy    PLAN: -  continue acetaminophen prn (maximum 3000mg  daily) - consider magnesium supplement 400mg  daily +/- riboflavin 400mg  daily - limit midrin usage; consider fioricet instead for moderate-severe migraine  Return in about 2 months (around 09/15/2015).    Penni Bombard, MD 04/13/3219, 2:54 AM Certified in Neurology, Neurophysiology and Neuroimaging  [facility] [address], [place], [place] [address] [phone]

## 2015-08-27 ENCOUNTER — Encounter (HOSPITAL_COMMUNITY): Payer: Self-pay

## 2015-08-27 ENCOUNTER — Inpatient Hospital Stay (HOSPITAL_COMMUNITY)
Admission: AD | Admit: 2015-08-27 | Discharge: 2015-08-27 | Disposition: A | Discharge status: Home / Self Care | Payer: BC Managed Care – PPO | Source: Ambulatory Visit | Attending: Obstetrics and Gynecology | Admitting: Obstetrics and Gynecology

## 2015-08-27 DIAGNOSIS — O9989 Other specified diseases and conditions complicating pregnancy, childbirth and the puerperium: Secondary | ICD-10-CM

## 2015-08-27 DIAGNOSIS — R0789 Other chest pain: Secondary | ICD-10-CM | POA: Diagnosis present

## 2015-08-27 DIAGNOSIS — O26892 Other specified pregnancy related conditions, second trimester: Secondary | ICD-10-CM | POA: Insufficient documentation

## 2015-08-27 DIAGNOSIS — Z3A25 25 weeks gestation of pregnancy: Secondary | ICD-10-CM | POA: Insufficient documentation

## 2015-08-27 DIAGNOSIS — R071 Chest pain on breathing: Secondary | ICD-10-CM | POA: Diagnosis not present

## 2015-08-27 LAB — COMPREHENSIVE METABOLIC PANEL
ALT: 13 U/L — ABNORMAL LOW (ref 14–54)
ANION GAP: 8 (ref 5–15)
AST: 20 U/L (ref 15–41)
Albumin: 2.8 g/dL — ABNORMAL LOW (ref 3.5–5.0)
Alkaline Phosphatase: 48 U/L (ref 38–126)
BILIRUBIN TOTAL: 0.4 mg/dL (ref 0.3–1.2)
BUN: 5 mg/dL — ABNORMAL LOW (ref 6–20)
CHLORIDE: 108 mmol/L (ref 101–111)
CO2: 22 mmol/L (ref 22–32)
Calcium: 9 mg/dL (ref 8.9–10.3)
Creatinine, Ser: 0.39 mg/dL — ABNORMAL LOW (ref 0.44–1.00)
GFR calc Af Amer: >60 mL/min (ref >60)
Glucose, Bld: 102 mg/dL — ABNORMAL HIGH (ref 65–99)
POTASSIUM: 3.7 mmol/L (ref 3.5–5.1)
Sodium: 138 mmol/L (ref 135–145)
TOTAL PROTEIN: 6.1 g/dL — AB (ref 6.5–8.1)

## 2015-08-27 LAB — BLOOD GAS, ARTERIAL
Acid-base deficit: 3 mmol/L — ABNORMAL HIGH (ref 0.0–2.0)
Bicarbonate: 19.5 mEq/L — ABNORMAL LOW (ref 20.0–24.0)
Drawn by: 405561
FIO2: 0.21
O2 SAT: 94 %
PH ART: 7.448 (ref 7.350–7.450)
TCO2: 20.3 mmol/L (ref 0–100)
pCO2 arterial: 28.6 mmHg — ABNORMAL LOW (ref 35.0–45.0)
pO2, Arterial: 91.3 mmHg (ref 80.0–100.0)

## 2015-08-27 LAB — TROPONIN I

## 2015-08-27 LAB — CBC
HCT: 36.3 % (ref 36.0–46.0)
Hemoglobin: 12.8 g/dL (ref 12.0–15.0)
MCH: 30.3 pg (ref 26.0–34.0)
MCHC: 35.3 g/dL (ref 30.0–36.0)
MCV: 86 fL (ref 78.0–100.0)
Platelets: 234 10*3/uL (ref 150–400)
RBC: 4.22 MIL/uL (ref 3.87–5.11)
RDW: 13.3 % (ref 11.5–15.5)
WBC: 14.1 10*3/uL — AB (ref 4.0–10.5)

## 2015-08-27 NOTE — MAU Note (Signed)
Pt reports she has felt some tightness in her chest today when she takes a deep breath. Also reports she has felt very tired today also. Pt is verbalizing with shortness of breath. Pox 100%

## 2015-08-27 NOTE — Discharge Instructions (Signed)
Chest Wall Pain Chest wall pain is pain felt in or around the chest bones and muscles. It may take up to 6 weeks to get better. It may take longer if you are active. Chest wall pain can happen on its own. Other times, things like germs, injury, coughing, or exercise can cause the pain. HOME CARE   Avoid activities that make you tired or cause pain. Try not to use your chest, belly (abdominal), or side muscles. Do not use heavy weights.  Put ice on the sore area.  Put ice in a plastic bag.  Place a towel between your skin and the bag.  Leave the ice on for 15-20 minutes for the first 2 days.  Only take medicine as told by your doctor. GET HELP RIGHT AWAY IF:   You have more pain or are very uncomfortable.  You have a fever.  Your chest pain gets worse.  You have new problems.  You feel sick to your stomach (nauseous) or throw up (vomit).  You start to sweat or feel lightheaded.  You have a cough with mucus (phlegm).  You cough up blood. MAKE SURE YOU:   Understand these instructions.  Will watch your condition.  Will get help right away if you are not doing well or get worse. Document Released: 05/11/2008 Document Revised: 02/15/2012 Document Reviewed: 07/20/2011 [facility] Patient Information 2015 [place], [place]. This information is not intended to replace advice given to you by your health care provider. Make sure you discuss any questions you have with your health care provider.  Costochondritis Costochondritis, sometimes called Tietze syndrome, is a swelling and irritation (inflammation) of the tissue (cartilage) that connects your ribs with your breastbone (sternum). It causes pain in the chest and rib area. Costochondritis usually goes away on its own over time. It can take up to 6 weeks or longer to get better, especially if you are unable to limit your activities. CAUSES  Some cases of costochondritis have no known cause. Possible causes include:  Injury  (trauma).  Exercise or activity such as lifting.  Severe coughing. SIGNS AND SYMPTOMS  Pain and tenderness in the chest and rib area.  Pain that gets worse when coughing or taking deep breaths.  Pain that gets worse with specific movements. DIAGNOSIS  Your health care provider will do a physical exam and ask about your symptoms. Chest X-rays or other tests may be done to rule out other problems. TREATMENT  Costochondritis usually goes away on its own over time. Your health care provider may prescribe medicine to help relieve pain. HOME CARE INSTRUCTIONS   Avoid exhausting physical activity. Try not to strain your ribs during normal activity. This would include any activities using chest, abdominal, and side muscles, especially if heavy weights are used.  Apply ice to the affected area for the first 2 days after the pain begins.  Put ice in a plastic bag.  Place a towel between your skin and the bag.  Leave the ice on for 20 minutes, 2-3 times a day.  Only take over-the-counter or prescription medicines as directed by your health care provider. SEEK MEDICAL CARE IF:  You have redness or swelling at the rib joints. These are signs of infection.  Your pain does not go away despite rest or medicine. SEEK IMMEDIATE MEDICAL CARE IF:   Your pain increases or you are very uncomfortable.  You have shortness of breath or difficulty breathing.  You cough up blood.  You have worse chest pains,  sweating, or vomiting.  You have a fever or persistent symptoms for more than 2-3 days.  You have a fever and your symptoms suddenly get worse. MAKE SURE YOU:   Understand these instructions.  Will watch your condition.  Will get help right away if you are not doing well or get worse. Document Released: 09/02/2005 Document Revised: 09/13/2013 Document Reviewed: 06/27/2013 [facility] Patient Information 2015 [place], [place]. This information is not intended to replace advice given to you  by your health care provider. Make sure you discuss any questions you have with your health care provider.

## 2015-08-27 NOTE — MAU Provider Note (Signed)
History     CSN: [id number]  Arrival date and time: 08/27/15 1245   First Provider Initiated Contact with Patient 08/27/15 2036      No chief complaint on file.  HPI [name] is a 34 y.o. [phone] at [redacted]w[redacted]d who presents to MAU today with complaint of tightness in her chest with deep inspiration and coughing. She also states that she has felt fatigued today. She endorses recent travel over the weekend. She returned from [place] on Sunday. She has been more active recently with travelling, unpacking and carrying her 36 year old. She denies recent URI symptoms, but has had exacerbation of her allergic rhinitis recently. She denies history of asthma. She denies fever, contractions, LOF or vaginal bleeding. She reports good fetal movement.   OB History    Gravida Para Term Preterm AB TAB SAB Ectopic Multiple Living   4 2 2  1  1   2       Past Medical History  Diagnosis Date  . Fibroid   . Hx of varicella   . Syncope     neurocardiac in HS and college, took meds no problems since then  . Anemia     hx of  . Headache(784.0)     hormonal migraines  . Miscarriage 2013    Past Surgical History  Procedure Laterality Date  . Hammer toe surgery  2005  . Arthroscopic repair acl    . Knee surgery  2009    ACL repair  . Eye surgery  1992  . Resection of prolapsed fribroid  2009  . Migraines      hx of, hormonal  . Vaginal delivery  2012, 2014    Family History  Problem Relation Age of Onset  . Goiter Mother   . Other Father     WPW syndrome wolf parkinson white syndrome  . Goiter Sister   . Cancer Maternal Grandfather     merkel cell carcinoma  . Cancer Paternal Grandmother     melanoma  . Cancer Paternal Grandfather     colon, prostate  . Bipolar disorder Maternal Aunt     Social History  Substance Use Topics  . Smoking status: Never Smoker   . Smokeless tobacco: Never Used  . Alcohol Use: No    Allergies: No Known Allergies  No prescriptions prior to  admission    Review of Systems  Constitutional: Negative for fever and malaise/fatigue.  Respiratory: Negative for shortness of breath.   Cardiovascular: Positive for chest pain.  Gastrointestinal: Negative for nausea, vomiting and abdominal pain.  Genitourinary: Negative for dysuria, urgency and frequency.       Neg - vaginal bleeding, discharge, LOF   Physical Exam   Blood pressure 110/71, pulse 91, temperature 98.5 F (36.9 C), temperature source Oral, resp. rate 18, height 5\' 2"  (1.575 m), weight 165 lb (74.844 kg), SpO2 100 %, unknown if currently breastfeeding.  Physical Exam  Nursing note and vitals reviewed. Constitutional: She is oriented to person, place, and time. She appears well-developed and well-nourished. No distress.  HENT:  Head: Normocephalic and atraumatic.  Cardiovascular: Normal rate, regular rhythm, normal heart sounds and intact distal pulses.   Respiratory: Effort normal and breath sounds normal. No respiratory distress. She has no wheezes. She has no rales. She exhibits no tenderness.  GI: Soft. She exhibits no distension and no mass. There is no tenderness. There is no rebound and no guarding.  Musculoskeletal: She exhibits edema.  Neurological: She is alert  and oriented to person, place, and time.  Skin: Skin is warm and dry. No erythema.  Psychiatric: She has a normal mood and affect.    Results for orders placed or performed during the hospital encounter of 08/27/15 (from the past 24 hour(s))  CBC     Status: Abnormal   Collection Time: 08/27/15  8:05 PM  Result Value Ref Range   WBC 14.1 (H) 4.0 - 10.5 K/uL   RBC 4.22 3.87 - 5.11 MIL/uL   Hemoglobin 12.8 12.0 - 15.0 g/dL   HCT 36.3 36.0 - 46.0 %   MCV 86.0 78.0 - 100.0 fL   MCH 30.3 26.0 - 34.0 pg   MCHC 35.3 30.0 - 36.0 g/dL   RDW 13.3 11.5 - 15.5 %   Platelets 234 150 - 400 K/uL  Troponin I     Status: None   Collection Time: 08/27/15  8:05 PM  Result Value Ref Range   Troponin I <0.03  <0.031 ng/mL  Comprehensive metabolic panel     Status: Abnormal   Collection Time: 08/27/15  8:05 PM  Result Value Ref Range   Sodium 138 135 - 145 mmol/L   Potassium 3.7 3.5 - 5.1 mmol/L   Chloride 108 101 - 111 mmol/L   CO2 22 22 - 32 mmol/L   Glucose, Bld 102 (H) 65 - 99 mg/dL   BUN 5 (L) 6 - 20 mg/dL   Creatinine, Ser 0.39 (L) 0.44 - 1.00 mg/dL   Calcium 9.0 8.9 - 10.3 mg/dL   Total Protein 6.1 (L) 6.5 - 8.1 g/dL   Albumin 2.8 (L) 3.5 - 5.0 g/dL   AST 20 15 - 41 U/L   ALT 13 (L) 14 - 54 U/L   Alkaline Phosphatase 48 38 - 126 U/L   Total Bilirubin 0.4 0.3 - 1.2 mg/dL   GFR calc non Af Amer >60 >60 mL/min   GFR calc Af Amer >60 >60 mL/min   Anion gap 8 5 - 15  Blood gas, arterial     Status: Abnormal   Collection Time: 08/27/15  9:20 PM  Result Value Ref Range   FIO2 0.21    pH, Arterial 7.448 7.350 - 7.450   pCO2 arterial 28.6 (L) 35.0 - 45.0 mmHg   pO2, Arterial 91.3 80.0 - 100.0 mmHg   Bicarbonate 19.5 (L) 20.0 - 24.0 mEq/L   TCO2 20.3 0 - 100 mmol/L   Acid-base deficit 3.0 (H) 0.0 - 2.0 mmol/L   O2 Saturation 94.0 %   Collection site RADIAL    Drawn by [id number]    Sample type ARTERIAL DRAW    Allens test (pass/fail) PASS PASS    Fetal Monitoring: Baseline: 140 bpm, moderate variability, + accelerations, no decelerations Contractions: none  MAU Course  Procedures None  MDM CBC, CMP, Troponin I and EKG today Pulse Ox 100 %, no respiratory distress noted EKG - normal Discussed with Dr. Radene Knee. ABG today Discussed ABG results with Dr. Radene Knee. Agrees with plan for discharge at this time and follow-up as scheduled in the office or sooner PRN.  Assessment and Plan  A: SIUP at [redacted]w[redacted]d Chest wall pain, muscle strain  P: Discharge home Tylenol PRN for pain Increased rest and avoid strenuous activity or heavy lifting Preterm labor precautions and warning signs for PE discussed Patient advised to follow-up with Dr. Radene Knee as scheduled or sooner if symptoms  worsen Patient may return to MAU as needed or if her condition were to change or worsen  Loretta Redden, PA-C  08/27/2015, 10:30 PM

## 2015-09-11 ENCOUNTER — Ambulatory Visit: Payer: BC Managed Care – PPO | Admitting: Diagnostic Neuroimaging

## 2015-11-04 LAB — OB RESULTS CONSOLE GBS: STREP GROUP B AG: POSITIVE

## 2015-12-03 ENCOUNTER — Telehealth (HOSPITAL_COMMUNITY): Payer: Self-pay | Admitting: *Deleted

## 2015-12-03 ENCOUNTER — Encounter (HOSPITAL_COMMUNITY): Payer: Self-pay | Admitting: *Deleted

## 2015-12-03 NOTE — Telephone Encounter (Signed)
Preadmission screen  

## 2015-12-05 ENCOUNTER — Encounter (HOSPITAL_COMMUNITY): Payer: Self-pay | Admitting: *Deleted

## 2015-12-05 ENCOUNTER — Telehealth (HOSPITAL_COMMUNITY): Payer: Self-pay | Admitting: *Deleted

## 2015-12-05 NOTE — Telephone Encounter (Signed)
Preadmission screen  

## 2015-12-06 ENCOUNTER — Encounter (HOSPITAL_COMMUNITY): Payer: Self-pay

## 2015-12-06 ENCOUNTER — Inpatient Hospital Stay (HOSPITAL_COMMUNITY)
Admission: RE | Admit: 2015-12-06 | Discharge: 2015-12-08 | DRG: 775 | Disposition: A | Discharge status: Home / Self Care | Payer: BC Managed Care – PPO | Source: Ambulatory Visit | Attending: Obstetrics & Gynecology | Admitting: Obstetrics & Gynecology

## 2015-12-06 DIAGNOSIS — Z3A40 40 weeks gestation of pregnancy: Secondary | ICD-10-CM | POA: Diagnosis not present

## 2015-12-06 DIAGNOSIS — O99824 Streptococcus B carrier state complicating childbirth: Secondary | ICD-10-CM | POA: Diagnosis present

## 2015-12-06 DIAGNOSIS — O09523 Supervision of elderly multigravida, third trimester: Secondary | ICD-10-CM

## 2015-12-06 DIAGNOSIS — Z3490 Encounter for supervision of normal pregnancy, unspecified, unspecified trimester: Secondary | ICD-10-CM

## 2015-12-06 LAB — CBC
HCT: 39.8 % (ref 36.0–46.0)
HEMOGLOBIN: 13 g/dL (ref 12.0–15.0)
MCH: 26.3 pg (ref 26.0–34.0)
MCHC: 32.7 g/dL (ref 30.0–36.0)
MCV: 80.6 fL (ref 78.0–100.0)
PLATELETS: 248 10*3/uL (ref 150–400)
RBC: 4.94 MIL/uL (ref 3.87–5.11)
RDW: 14 % (ref 11.5–15.5)
WBC: 8.9 10*3/uL (ref 4.0–10.5)

## 2015-12-06 LAB — RPR: RPR: NONREACTIVE

## 2015-12-06 MED ORDER — LANOLIN HYDROUS EX OINT: TOPICAL_OINTMENT | CUTANEOUS | Status: DC | PRN

## 2015-12-06 MED ORDER — LACTATED RINGERS IV SOLN: 500.0000 mL | INTRAVENOUS | Status: DC | PRN

## 2015-12-06 MED ORDER — LIDOCAINE HCL (PF) 1 % IJ SOLN
30.0000 mL | INTRAMUSCULAR | Status: AC | PRN
  Administered 2015-12-06 (×2): 30 mL via SUBCUTANEOUS
  Filled 2015-12-06 (×2): qty 30

## 2015-12-06 MED ORDER — OXYCODONE-ACETAMINOPHEN 5-325 MG PO TABS
1.0000 | ORAL_TABLET | ORAL | Status: DC | PRN
  Administered 2015-12-07 – 2015-12-08 (×5): 1 via ORAL
  Filled 2015-12-06 (×5): qty 1

## 2015-12-06 MED ORDER — IBUPROFEN 600 MG PO TABS
600.0000 mg | ORAL_TABLET | Freq: Four times a day (QID) | ORAL | Status: DC
  Administered 2015-12-07 – 2015-12-08 (×7): 600 mg via ORAL
  Filled 2015-12-06 (×7): qty 1

## 2015-12-06 MED ORDER — DIPHENHYDRAMINE HCL 25 MG PO CAPS: 25.0000 mg | ORAL_CAPSULE | Freq: Four times a day (QID) | ORAL | Status: DC | PRN

## 2015-12-06 MED ORDER — FLEET ENEMA 7-19 GM/118ML RE ENEM: 1.0000 | ENEMA | RECTAL | Status: DC | PRN

## 2015-12-06 MED ORDER — ZOLPIDEM TARTRATE 5 MG PO TABS: 5.0000 mg | ORAL_TABLET | Freq: Every evening | ORAL | Status: DC | PRN

## 2015-12-06 MED ORDER — TETANUS-DIPHTH-ACELL PERTUSSIS 5-2.5-18.5 LF-MCG/0.5 IM SUSP: 0.5000 mL | Freq: Once | INTRAMUSCULAR | Status: DC

## 2015-12-06 MED ORDER — OXYTOCIN 40 UNITS IN LACTATED RINGERS INFUSION - SIMPLE MED: 62.5000 mL/h | INTRAVENOUS | Status: DC

## 2015-12-06 MED ORDER — FENTANYL 2.5 MCG/ML BUPIVACAINE 1/10 % EPIDURAL INFUSION (WH - ANES): 14.0000 mL/h | INTRAMUSCULAR | Status: DC | PRN

## 2015-12-06 MED ORDER — EPHEDRINE 5 MG/ML INJ
10.0000 mg | INTRAVENOUS | Status: DC | PRN
  Filled 2015-12-06: qty 2

## 2015-12-06 MED ORDER — OXYTOCIN BOLUS FROM INFUSION: 500.0000 mL | INTRAVENOUS | Status: DC

## 2015-12-06 MED ORDER — ONDANSETRON HCL 4 MG PO TABS
4.0000 mg | ORAL_TABLET | ORAL | Status: DC | PRN
Start: 2015-12-06 — End: 2015-12-08

## 2015-12-06 MED ORDER — PENICILLIN G POTASSIUM 5000000 UNITS IJ SOLR
2.5000 10*6.[IU] | INTRAVENOUS | Status: DC
  Administered 2015-12-06 (×2): 2.5 10*6.[IU] via INTRAVENOUS
  Filled 2015-12-06 (×5): qty 2.5

## 2015-12-06 MED ORDER — BENZOCAINE-MENTHOL 20-0.5 % EX AERO
1.0000 "application " | INHALATION_SPRAY | CUTANEOUS | Status: DC | PRN
  Administered 2015-12-06: 1 via TOPICAL
  Filled 2015-12-06: qty 56

## 2015-12-06 MED ORDER — PENICILLIN G POTASSIUM 5000000 UNITS IJ SOLR
5.0000 10*6.[IU] | Freq: Once | INTRAVENOUS | Status: AC
  Administered 2015-12-06: 5 10*6.[IU] via INTRAVENOUS
  Filled 2015-12-06: qty 5

## 2015-12-06 MED ORDER — OXYCODONE-ACETAMINOPHEN 5-325 MG PO TABS: 2.0000 | ORAL_TABLET | ORAL | Status: DC | PRN

## 2015-12-06 MED ORDER — PRENATAL MULTIVITAMIN CH
1.0000 | ORAL_TABLET | Freq: Every day | ORAL | Status: DC
  Administered 2015-12-07 – 2015-12-08 (×2): 1 via ORAL
  Filled 2015-12-06 (×2): qty 1

## 2015-12-06 MED ORDER — CITRIC ACID-SODIUM CITRATE 334-500 MG/5ML PO SOLN: 30.0000 mL | ORAL | Status: DC | PRN

## 2015-12-06 MED ORDER — ONDANSETRON HCL 4 MG/2ML IJ SOLN: 4.0000 mg | INTRAMUSCULAR | Status: DC | PRN

## 2015-12-06 MED ORDER — OXYCODONE-ACETAMINOPHEN 5-325 MG PO TABS: 1.0000 | ORAL_TABLET | ORAL | Status: DC | PRN

## 2015-12-06 MED ORDER — BUTORPHANOL TARTRATE 1 MG/ML IJ SOLN: 1.0000 mg | INTRAMUSCULAR | Status: DC | PRN

## 2015-12-06 MED ORDER — ONDANSETRON HCL 4 MG/2ML IJ SOLN: 4.0000 mg | Freq: Four times a day (QID) | INTRAMUSCULAR | Status: DC | PRN

## 2015-12-06 MED ORDER — SENNOSIDES-DOCUSATE SODIUM 8.6-50 MG PO TABS
2.0000 | ORAL_TABLET | ORAL | Status: DC
  Administered 2015-12-07 (×2): 2 via ORAL
  Filled 2015-12-06 (×2): qty 2

## 2015-12-06 MED ORDER — SIMETHICONE 80 MG PO CHEW: 80.0000 mg | CHEWABLE_TABLET | ORAL | Status: DC | PRN

## 2015-12-06 MED ORDER — LACTATED RINGERS IV SOLN
INTRAVENOUS | Status: DC
  Administered 2015-12-06: 08:00:00 via INTRAVENOUS

## 2015-12-06 MED ORDER — OXYTOCIN 40 UNITS IN LACTATED RINGERS INFUSION - SIMPLE MED
1.0000 m[IU]/min | INTRAVENOUS | Status: DC
  Administered 2015-12-06: 2 m[IU]/min via INTRAVENOUS
  Filled 2015-12-06: qty 1000

## 2015-12-06 MED ORDER — DIPHENHYDRAMINE HCL 50 MG/ML IJ SOLN: 12.5000 mg | INTRAMUSCULAR | Status: DC | PRN

## 2015-12-06 MED ORDER — WITCH HAZEL-GLYCERIN EX PADS
1.0000 | MEDICATED_PAD | CUTANEOUS | Status: DC | PRN
Start: 2015-12-06 — End: 2015-12-08
  Administered 2015-12-08: 1 via TOPICAL

## 2015-12-06 MED ORDER — TERBUTALINE SULFATE 1 MG/ML IJ SOLN
0.2500 mg | Freq: Once | INTRAMUSCULAR | Status: DC | PRN
  Filled 2015-12-06: qty 1

## 2015-12-06 MED ORDER — ACETAMINOPHEN 325 MG PO TABS: 650.0000 mg | ORAL_TABLET | ORAL | Status: DC | PRN

## 2015-12-06 MED ORDER — PHENYLEPHRINE 40 MCG/ML (10ML) SYRINGE FOR IV PUSH (FOR BLOOD PRESSURE SUPPORT)
80.0000 ug | PREFILLED_SYRINGE | INTRAVENOUS | Status: DC | PRN
Start: 2015-12-06 — End: 2015-12-06
  Filled 2015-12-06: qty 2

## 2015-12-06 MED ORDER — DIBUCAINE 1 % RE OINT
1.0000 | TOPICAL_OINTMENT | RECTAL | Status: DC | PRN
Start: 2015-12-06 — End: 2015-12-08

## 2015-12-06 NOTE — Lactation Note (Signed)
This note was copied from the chart of Loretta Siniya Bates. Lactation Consultation Note Initial visit at 3 hours of age.  Mom reports a good feeding and now baby is asleep in crib.  Mom is experienced with older 2 breastfeeding for 1 year each.  Basics reviewed.  Fob at bedside supportive.  [facility] LC resources given and discussed.  Encouraged to feed with early cues on demand.  Early newborn behavior discussed.  Hand expression demonstrated by mom with colostrum visible.  Mom to call for assist as needed.    Patient Name: Loretta Bates Today's Date: 12/06/2015 Reason for consult: Initial assessment   Maternal Data Has patient been taught Hand Expression?: Yes Does the patient have breastfeeding experience prior to this delivery?: Yes  Feeding Feeding Type: Breast Fed Length of feed: 20 min  LATCH Score/Interventions Latch: Grasps breast easily, tongue down, lips flanged, rhythmical sucking.  Audible Swallowing: A few with stimulation Intervention(s): Skin to skin  Type of Nipple: Everted at rest and after stimulation  Comfort (Breast/Nipple): Soft / non-tender     Hold (Positioning): No assistance needed to correctly position infant at breast. Intervention(s): Breastfeeding basics reviewed  LATCH Score: 9  Lactation Tools Discussed/Used     Consult Status Consult Status: Follow-up Date: 12/07/15 Follow-up type: In-patient    Justice Britain 12/06/2015, 9:14 PM

## 2015-12-06 NOTE — H&P (Signed)
Loretta Bates is a 34 y.o. female presenting for elective IOL.  Antepartum course complicated by AMA; normal testing.  GBS positive.    Maternal Medical History:  Fetal activity: Perceived fetal activity is normal.   Last perceived fetal movement was within the past hour.    Prenatal complications: no prenatal complications Prenatal Complications - Diabetes: none.    OB History    Gravida Para Term Preterm AB TAB SAB Ectopic Multiple Living   4 2 2  1  1   2      Past Medical History  Diagnosis Date  . Fibroid   . Hx of varicella   . Syncope     neurocardiac in HS and college, took meds no problems since then  . Anemia     hx of  . Headache(784.0)     hormonal migraines  . Miscarriage 2013   Past Surgical History  Procedure Laterality Date  . Hammer toe surgery  2005  . Arthroscopic repair acl    . Knee surgery  2009    ACL repair  . Eye surgery  1992  . Resection of prolapsed fribroid  2009  . Vaginal delivery  2012, 2014   Family History: family history includes Bipolar disorder in her maternal aunt; Cancer in her maternal grandfather, paternal grandfather, and paternal grandmother; Dementia in her maternal grandmother; Goiter in her mother and sister; Other in her father. Social History:  reports that she has never smoked. She has never used smokeless tobacco. She reports that she does not drink alcohol or use illicit drugs.   Prenatal Transfer Tool  Maternal Diabetes: No Genetic Screening: Normal Maternal Ultrasounds/Referrals: Normal Fetal Ultrasounds or other Referrals:  None Maternal Substance Abuse:  No Significant Maternal Medications:  None Significant Maternal Lab Results:  Lab values include: Group B Strep positive Other Comments:  None  ROS    Blood pressure 114/73, pulse 85, temperature 97.5 F (36.4 C), temperature source Oral, height 5\' 2"  (1.575 m), weight 78.472 kg (173 lb), unknown if currently breastfeeding. Maternal Exam:  Uterine  Assessment: Contraction strength is mild.  Contraction frequency is rare.   Abdomen: Patient reports no abdominal tenderness. Fundal height is c/w dates.   Estimated fetal weight is 8#.       Physical Exam  Constitutional: She is oriented to person, place, and time. She appears well-developed and well-nourished.  GI: Soft. There is no rebound and no guarding.  Neurological: She is alert and oriented to person, place, and time.  Skin: Skin is warm and dry.  Psychiatric: She has a normal mood and affect. Her behavior is normal.    Prenatal labs: ABO, Rh: B/Positive/-- (05/17 0000) Antibody: Negative (05/17 0000) Rubella: Immune (05/17 0000) RPR: Nonreactive (05/17 0000)  HBsAg: Negative (05/17 0000)  HIV: Non-reactive (05/17 0000)  GBS: Positive (11/28 0000)   Assessment/Plan: 34yo [id number] at [redacted]w[redacted]d for IOL -AROM when IV placed -Epidural when ready -Anticipate NSVD   Loretta Bates 12/06/2015, 8:20 AM

## 2015-12-07 LAB — CBC
HEMATOCRIT: 32.8 % — AB (ref 36.0–46.0)
HEMOGLOBIN: 11.4 g/dL — AB (ref 12.0–15.0)
MCH: 28.1 pg (ref 26.0–34.0)
MCHC: 34.8 g/dL (ref 30.0–36.0)
MCV: 80.8 fL (ref 78.0–100.0)
Platelets: 236 10*3/uL (ref 150–400)
RBC: 4.06 MIL/uL (ref 3.87–5.11)
RDW: 14.1 % (ref 11.5–15.5)
WBC: 11.3 10*3/uL — AB (ref 4.0–10.5)

## 2015-12-07 NOTE — Progress Notes (Signed)
Post Partum Day 1 Subjective: no complaints, up ad lib, voiding and tolerating PO  Objective: Blood pressure 97/60, pulse 81, temperature 97.9 F (36.6 C), temperature source Oral, resp. rate 18, height 5\' 2"  (1.575 m), weight 78.472 kg (173 lb), unknown if currently breastfeeding.  Physical Exam:  General: alert, cooperative and appears stated age Lochia: appropriate Uterine Fundus: firm Incision: healing well, no significant drainage, no dehiscence DVT Evaluation: No evidence of DVT seen on physical exam. Negative Homan's sign. No cords or calf tenderness.   Recent Labs  12/06/15 0818 12/07/15 0552  HGB 13.0 11.4*  HCT 39.8 32.8*    Assessment/Plan: Plan for discharge tomorrow and Breastfeeding   LOS: 1 day   Loretta Bates 12/07/2015, 10:37 AM

## 2015-12-08 MED ORDER — OXYCODONE-ACETAMINOPHEN 5-325 MG PO TABS: 1.0000 | ORAL_TABLET | ORAL | Status: DC | PRN

## 2015-12-08 MED ORDER — IBUPROFEN 600 MG PO TABS: 600.0000 mg | ORAL_TABLET | Freq: Four times a day (QID) | ORAL | Status: DC

## 2015-12-08 NOTE — Lactation Note (Signed)
This note was copied from the chart of Loretta Bates. Lactation Consultation Note  Patient Name: Loretta Bates Today's Date: 12/08/2015 Reason for consult: Follow-up assessment   With this experienced breast feeding mom and term baby. Mom reports breasat feeding goind well, last latch score 9, baby now 40 hours, has had 3 woid and 7 stools. Mom has some nipple tenderness at the beginning of a feeding. She was latching in craadle _ advised mom to try   cross cradle ito get a deeperlatch, and once latched,  then switch to cradle hold. Mom knows to call for questions/concerns.    Maternal Data    Feeding    LATCH Score/Interventions                      Lactation Tools Discussed/Used     Consult Status Consult Status: Complete Follow-up type: Call as needed    Tonna Corner 12/08/2015, 9:47 AM

## 2015-12-08 NOTE — Discharge Summary (Signed)
Obstetric Discharge Summary Reason for Admission: induction of labor Prenatal Procedures: none Intrapartum Procedures: spontaneous vaginal delivery Postpartum Procedures: none Complications-Operative and Postpartum: 2nd degree perineal laceration HEMOGLOBIN  Date Value Ref Range Status  12/07/2015 11.4* 12.0 - 15.0 g/dL Final   HCT  Date Value Ref Range Status  12/07/2015 32.8* 36.0 - 46.0 % Final    Physical Exam:  General: alert, cooperative and appears stated age 73: appropriate Uterine Fundus: firm Incision: healing well, no significant drainage, no dehiscence DVT Evaluation: No evidence of DVT seen on physical exam. Negative Homan's sign. No cords or calf tenderness.  Discharge Diagnoses: Term Pregnancy-delivered  Discharge Information: Date: 12/08/2015 Activity: pelvic rest Diet: routine Medications: PNV, Ibuprofen and Percocet Condition: stable Instructions: refer to practice specific booklet Discharge to: home   Newborn Data: Live born female  Birth Weight: 7 lb 11.6 oz (3505 g) APGAR: 8, 9  Home with mother.  Jacquline Terrill, [place] 12/08/2015, 10:24 AM

## 2015-12-08 NOTE — Discharge Instructions (Signed)
Call MD for T>100.4, heavy vaginal bleeding, severe abdominal pain, or respiratory distress.  Call office to schedule postpartum visit in 6 weeks.  No driving while taking narcotics.  Pelvic rest x 6 weeks.  °

## 2015-12-08 NOTE — Progress Notes (Signed)
Post Partum Day 2 Subjective: no complaints, up ad lib, voiding, tolerating PO and + flatus  Objective: Blood pressure 93/57, pulse 71, temperature 98 F (36.7 C), temperature source Oral, resp. rate 18, height 5\' 2"  (1.575 m), weight 78.472 kg (173 lb), unknown if currently breastfeeding.  Physical Exam:  General: alert, cooperative and appears stated age Lochia: appropriate Uterine Fundus: firm Incision: healing well, no significant drainage, no dehiscence DVT Evaluation: No evidence of DVT seen on physical exam. Negative Homan's sign. No cords or calf tenderness.   Recent Labs  12/06/15 0818 12/07/15 0552  HGB 13.0 11.4*  HCT 39.8 32.8*    Assessment/Plan: Discharge home and Breastfeeding   LOS: 2 days   Loretta Bates 12/08/2015, 10:21 AM

## 2022-07-16 ENCOUNTER — Encounter (HOSPITAL_BASED_OUTPATIENT_CLINIC_OR_DEPARTMENT_OTHER): Payer: Self-pay | Admitting: Cardiovascular Disease

## 2022-07-16 ENCOUNTER — Ambulatory Visit (HOSPITAL_BASED_OUTPATIENT_CLINIC_OR_DEPARTMENT_OTHER): Payer: BC Managed Care – PPO | Admitting: Cardiovascular Disease

## 2022-07-16 DIAGNOSIS — R002 Palpitations: Secondary | ICD-10-CM | POA: Diagnosis not present

## 2022-07-16 DIAGNOSIS — R55 Syncope and collapse: Secondary | ICD-10-CM | POA: Diagnosis not present

## 2022-07-16 HISTORY — DX: Palpitations: R00.2

## 2022-07-16 HISTORY — DX: Syncope and collapse: R55

## 2022-07-16 NOTE — Assessment & Plan Note (Signed)
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  It sounds as though this episode was the same.  It was triggered by abdominal pain and defecation.  We discussed the importance of trying to hydrate.  She can also use compression stockings.  We will check a CBC, CMP, TSH, and magnesium.  She has no evidence of any structural heart disease.  Her EKG does show short PR interval but no evidence of preexcitation.  She has not had any palpitations preceding her syncopal events.  No additional testing for now.

## 2022-07-16 NOTE — Progress Notes (Signed)
Cardiology Office Note   Date:  07/16/2022   ID:  Loretta Bates, DOB 1981-06-24, MRN [id number]  PCP:  Willy Eddy, MD  Cardiologist:   Skeet Latch, MD   No chief complaint on file.   History of Present Illness: Loretta Bates is a 41 y.o. female who is being seen today for the evaluation of  at the request of Willy Eddy, MD.  She has a history of neurocardiogenic syncope.  She had history of syncopal episodes in college in high school but episodes eventually subsided.  On 05/05/2022 she had an episode of syncope after defecation.  She saw her PCP on 06/2022 and reported the symptoms and was referred to cardiology.  EKG at that time showed sinus rhythm with a short PR interval.  She reports that she was sitting on the toilet when she had abdominal cramping and pain.  She had a bowel movement.  While sitting at the toilet she got lightheaded and passed out.  She felt flushed and her head.  There is no chest pain or palpitations.  She notes that her father has a history of WPW and had an ablation.  She likes to walk for exercise and has no exertional symptoms.  She has no lower extremity edema, orthopnea, or PND.  She does drink about 1 cup of coffee daily and does not drink alcohol.    Past Medical History:  Diagnosis Date   Anemia    hx of   Fibroid    Headache(784.0)    hormonal migraines   Hx of varicella    Miscarriage 2013   Palpitations 07/16/2022   Syncope    neurocardiac in HS and college, took meds no problems since then   Syncope and collapse 07/16/2022    Past Surgical History:  Procedure Laterality Date   ARTHROSCOPIC REPAIR ACL     EYE SURGERY  1992   HAMMER TOE SURGERY  2005   KNEE SURGERY  2009   ACL repair   resection of prolapsed fribroid  2009   VAGINAL DELIVERY  2012, 2014     Current Outpatient Medications  Medication Sig Dispense Refill   ibuprofen (ADVIL,MOTRIN) 600 MG tablet Take 1 tablet (600 mg total) by mouth every 6 (six) hours. 30  tablet 0   No current facility-administered medications for this visit.    Allergies:   Patient has no known allergies.    Social History:  The patient  reports that she has never smoked. She has never used smokeless tobacco. She reports that she does not drink alcohol and does not use drugs.   Family History:  The patient's family history includes Bipolar disorder in her maternal aunt; Cancer in her maternal grandfather, paternal grandfather, and paternal grandmother; Dementia in her maternal grandmother; Goiter in her mother and sister; Hypertension in her father; Other in her father; Syncope episode in her father; Yves Dill Parkinson White syndrome in her father.    ROS:  Please see the history of present illness.   Otherwise, review of systems are positive for none.   All other systems are reviewed and negative.    PHYSICAL EXAM: VS:  BP 120/82 (BP Location: Right Arm, Patient Position: Sitting, Cuff Size: Normal)   Pulse 70   Ht '5\' 3"'$  (1.6 m)   Wt 169 lb (76.7 kg)   BMI 29.94 kg/m  , BMI Body mass index is 29.94 kg/m. GENERAL:  Well appearing HEENT:  Pupils equal round and reactive, fundi not visualized, oral  mucosa unremarkable NECK:  No jugular venous distention, waveform within normal limits, carotid upstroke brisk and symmetric, no bruits, no thyromegaly LYMPHATICS:  No cervical adenopathy LUNGS:  Clear to auscultation bilaterally HEART:  RRR.  PMI not displaced or sustained,S1 and S2 within normal limits, no S3, no S4, no clicks, no rubs, no murmurs ABD:  Flat, positive bowel sounds normal in frequency in pitch, no bruits, no rebound, no guarding, no midline pulsatile mass, no hepatomegaly, no splenomegaly EXT:  2 plus pulses throughout, no edema, no cyanosis no clubbing SKIN:  No rashes no nodules NEURO:  Cranial nerves II through XII grossly intact, motor grossly intact throughout PSYCH:  Cognitively intact, oriented to person place and time   EKG:  EKG is ordered  today. The ekg ordered today demonstrates sinus rhythm.  Rate 70 bpm.  Short PR.  No pre-excitation   Recent Labs: No results found for requested labs within last 365 days.    Lipid Panel No results found for: "CHOL", "TRIG", "HDL", "CHOLHDL", "VLDL", "LDLCALC", "LDLDIRECT"    Wt Readings from Last 3 Encounters:  07/16/22 169 lb (76.7 kg)  12/06/15 173 lb (78.5 kg)  08/27/15 165 lb (74.8 kg)     ASSESSMENT AND PLAN:  Syncope and collapse [name] has a history of neurocardiogenic syncope.  It sounds as though this episode was the same.  It was triggered by abdominal pain and defecation.  We discussed the importance of trying to hydrate.  She can also use compression stockings.  We will check a CBC, CMP, TSH, and magnesium.  She has no evidence of any structural heart disease.  Her EKG does show short PR interval but no evidence of preexcitation.  She has not had any palpitations preceding her syncopal events.  No additional testing for now.   Palpitations [name] has palpitations that mostly occur when she is lying in bed and her heart is racing.  She notes that she has a hard time falling asleep at times.  We discussed considering getting a monitor.  However given that it is situational she has not exertional symptoms, it sounds like it is likely sinus tachycardia in the setting of anxiety.  Recommend checking a CBC, CMP, TSH, and magnesium.  We did discuss considering a Kardia mobile device.    Current medicines are reviewed at length with the patient today.  The patient  concerns regarding medicines.  The following changes have been made:  none  Labs/ tests ordered today include:   Orders Placed This Encounter  Procedures   CBC with Differential/Platelet   TSH   T4, free   Magnesium   Comprehensive metabolic panel   EKG [id number]     Disposition:   FU with Annaliese Saez C. Oval Linsey, MD, [facility] in 3 months    Signed, Nyiesha Beever C. Oval Linsey, MD, [facility]  07/16/2022 6:10 PM     [facility]

## 2022-07-16 NOTE — Assessment & Plan Note (Signed)
Loretta Bates has palpitations that mostly occur when she is lying in bed and her heart is racing.  She notes that she has a hard time falling asleep at times.  We discussed considering getting a monitor.  However given that it is situational she has not exertional symptoms, it sounds like it is likely sinus tachycardia in the setting of anxiety.  Recommend checking a CBC, CMP, TSH, and magnesium.  We did discuss considering a Kardia mobile device.

## 2022-07-16 NOTE — Patient Instructions (Signed)
Medication Instructions:  .Your physician recommends that you continue on your current medications as directed. Please refer to the Current Medication list given to you today.   *If you need a refill on your cardiac medications before your next appointment, please call your pharmacy*  Lab Work: THS/FT4/MAGNESIUM/CBC/CMET TODAY   If you have labs (blood work) drawn today and your tests are completely normal, you will receive your results only by: Ord (if you have MyChart) OR A paper copy in the mail If you have any lab test that is abnormal or we need to change your treatment, we will call you to review the results.  Testing/Procedures: NONE   Follow-Up: At [facility], you and your health needs are our priority.  As part of our continuing mission to provide you with exceptional heart care, we have created designated Provider Care Teams.  These Care Teams include your primary Cardiologist (physician) and Advanced Practice Providers (APPs -  Physician Assistants and Nurse Practitioners) who all work together to provide you with the care you need, when you need it.  We recommend signing up for the patient portal called "MyChart".  Sign up information is provided on this After Visit Summary.  MyChart is used to connect with patients for Virtual Visits (Telemedicine).  Patients are able to view lab/test results, encounter notes, upcoming appointments, etc.  Non-urgent messages can be sent to your provider as well.   To learn more about what you can do with MyChart, go to [email or web address].    Your next appointment:   3 month(s)  The format for your next appointment:   In Person  Provider:   Skeet Latch, MD{  Other Instructions [place]

## 2022-07-17 LAB — COMPREHENSIVE METABOLIC PANEL
ALT: 15 IU/L (ref 0–32)
AST: 15 IU/L (ref 0–40)
Albumin/Globulin Ratio: 1.7 (ref 1.2–2.2)
Albumin: 4.3 g/dL (ref 3.9–4.9)
Alkaline Phosphatase: 49 IU/L (ref 44–121)
BUN/Creatinine Ratio: 18 (ref 9–23)
BUN: 10 mg/dL (ref 6–24)
Bilirubin Total: 0.3 mg/dL (ref 0.0–1.2)
CO2: 21 mmol/L (ref 20–29)
Calcium: 9.3 mg/dL (ref 8.7–10.2)
Chloride: 104 mmol/L (ref 96–106)
Creatinine, Ser: 0.57 mg/dL (ref 0.57–1.00)
Globulin, Total: 2.5 g/dL (ref 1.5–4.5)
Glucose: 81 mg/dL (ref 70–99)
Potassium: 4.5 mmol/L (ref 3.5–5.2)
Sodium: 138 mmol/L (ref 134–144)
Total Protein: 6.8 g/dL (ref 6.0–8.5)
eGFR: 116 mL/min/{1.73_m2} (ref >59)

## 2022-07-17 LAB — CBC WITH DIFFERENTIAL/PLATELET
Basophils Absolute: 0 10*3/uL (ref 0.0–0.2)
Basos: 1 %
EOS (ABSOLUTE): 0.2 10*3/uL (ref 0.0–0.4)
Eos: 4 %
Hematocrit: 43.1 % (ref 34.0–46.6)
Hemoglobin: 14.3 g/dL (ref 11.1–15.9)
Immature Grans (Abs): 0 10*3/uL (ref 0.0–0.1)
Immature Granulocytes: 0 %
Lymphocytes Absolute: 2.2 10*3/uL (ref 0.7–3.1)
Lymphs: 34 %
MCH: 28.3 pg (ref 26.6–33.0)
MCHC: 33.2 g/dL (ref 31.5–35.7)
MCV: 85 fL (ref 79–97)
Monocytes Absolute: 0.5 10*3/uL (ref 0.1–0.9)
Monocytes: 8 %
Neutrophils Absolute: 3.4 10*3/uL (ref 1.4–7.0)
Neutrophils: 53 %
Platelets: 297 10*3/uL (ref 150–450)
RBC: 5.06 x10E6/uL (ref 3.77–5.28)
RDW: 12.7 % (ref 11.7–15.4)
WBC: 6.4 10*3/uL (ref 3.4–10.8)

## 2022-07-17 LAB — T4, FREE: Free T4: 1.22 ng/dL (ref 0.82–1.77)

## 2022-07-17 LAB — TSH: TSH: 1.46 u[IU]/mL (ref 0.450–4.500)

## 2022-07-17 LAB — MAGNESIUM: Magnesium: 2.2 mg/dL (ref 1.6–2.3)

## 2022-10-27 ENCOUNTER — Ambulatory Visit (HOSPITAL_BASED_OUTPATIENT_CLINIC_OR_DEPARTMENT_OTHER): Payer: BC Managed Care – PPO | Admitting: Cardiovascular Disease

## 2022-10-27 ENCOUNTER — Encounter (HOSPITAL_BASED_OUTPATIENT_CLINIC_OR_DEPARTMENT_OTHER): Payer: Self-pay

## 2022-10-27 ENCOUNTER — Encounter (HOSPITAL_BASED_OUTPATIENT_CLINIC_OR_DEPARTMENT_OTHER): Payer: Self-pay | Admitting: Cardiovascular Disease

## 2022-10-27 VITALS — BP 102/70 | HR 78 | Ht 63.0 in | Wt 172.0 lb

## 2022-10-27 DIAGNOSIS — R0789 Other chest pain: Secondary | ICD-10-CM

## 2022-10-27 DIAGNOSIS — E7800 Pure hypercholesterolemia, unspecified: Secondary | ICD-10-CM | POA: Diagnosis not present

## 2022-10-27 DIAGNOSIS — R55 Syncope and collapse: Secondary | ICD-10-CM

## 2022-10-27 DIAGNOSIS — E785 Hyperlipidemia, unspecified: Secondary | ICD-10-CM

## 2022-10-27 HISTORY — DX: Other chest pain: R07.89

## 2022-10-27 HISTORY — DX: Hyperlipidemia, unspecified: E78.5

## 2022-10-27 NOTE — Progress Notes (Signed)
Cardiology Office Note   Date:  10/27/2022   ID:  Kaylamarie Bates, DOB [birth year], MRN [id number]  PCP:  Lennie Odor, [place]  Cardiologist:   Skeet Latch, MD   No chief complaint on file.   History of Present Illness: Loretta Bates is a 41 y.o. female with palpitations and syncope her for follow-up. She has a history of neurocardiogenic syncope.  She had history of syncopal episodes in college in high school but episodes eventually subsided.  On 05/05/2022 she had an episode of syncope after defecation.  She saw her PCP on 06/2022 and reported the symptoms and was referred to cardiology.  EKG at that time showed sinus rhythm with a short PR interval.  She reports that she was sitting on the toilet when she had abdominal cramping and pain.  She had a bowel movement.  While sitting at the toilet she got lightheaded and passed out.  She felt flushed and her head.  There is no chest pain or palpitations.  She notes that her father has a history of WPW and had an ablation.  Her syncopal episode was thought to be vagal. Her palpitations were anxiety related and we recommended that she get a Chad mobile device.   Today, she complains of chest tightness which she attributes to stress and anxiety. She reports these episodes resolve after a couple of days. She denies any chest tightness while exercising or with exertion. She denies any lightheadedness.  She has not had any recurrent syncopal episodes.  At least twice a week when not working she tries to walk around her neighborhood for exercise. She tries to work 2-3 days a week. She denies any palpitations, chest pain, shortness of breath, or peripheral edema. No lightheadedness, headaches, syncope, orthopnea, or PND.  She notes that when she saw her PCP her cholesterol was elevated.  She is unsure if this was new.  Past Medical History:  Diagnosis Date   Anemia    hx of   Atypical chest pain 10/27/2022   Fibroid    Headache(784.0)    hormonal  migraines   Hx of varicella    Hyperlipidemia 10/27/2022   Miscarriage 2013   Palpitations 07/16/2022   Syncope    neurocardiac in HS and college, took meds no problems since then   Syncope and collapse 07/16/2022    Past Surgical History:  Procedure Laterality Date   ARTHROSCOPIC REPAIR ACL     EYE SURGERY  1992   HAMMER TOE SURGERY  2005   KNEE SURGERY  2009   ACL repair   resection of prolapsed fribroid  2009   VAGINAL DELIVERY  2012, 2014     Current Outpatient Medications  Medication Sig Dispense Refill   cetirizine (ZYRTEC) 10 MG tablet Take 10 mg by mouth daily.     No current facility-administered medications for this visit.    Allergies:   Patient has no known allergies.    Social History:  The patient  reports that she has never smoked. She has never used smokeless tobacco. She reports that she does not drink alcohol and does not use drugs.   Family History:  The patient's family history includes Bipolar disorder in her maternal aunt; Cancer in her maternal grandfather, paternal grandfather, and paternal grandmother; Dementia in her maternal grandmother; Goiter in her mother and sister; Hypertension in her father; Other in her father; Syncope episode in her father; Yves Dill Parkinson White syndrome in her father.    ROS:  Please  see the history of present illness.    (+) Chest tightness (+) Anxiety All other systems are reviewed and negative.    PHYSICAL EXAM: VS:  BP 102/70 (BP Location: Left Arm, Patient Position: Sitting, Cuff Size: Large)   Pulse 78   Ht '5\' 3"'$  (1.6 m)   Wt 172 lb (78 kg)   SpO2 97%   BMI 30.47 kg/m  , BMI Body mass index is 30.47 kg/m. GENERAL:  Well appearing HEENT:  Pupils equal round and reactive, fundi not visualized, oral mucosa unremarkable NECK:  No jugular venous distention, waveform within normal limits, carotid upstroke brisk and symmetric, no bruits, no thyromegaly LUNGS:  Clear to auscultation bilaterally HEART:   RRR.   PMI not displaced or sustained,S1 and S2 within normal limits, no S3, no S4, no clicks, no rubs, no murmurs ABD:  Flat, positive bowel sounds normal in frequency in pitch, no bruits, no rebound, no guarding, no midline pulsatile mass, no hepatomegaly, no splenomegaly EXT:  2 plus pulses throughout, no edema, no cyanosis no clubbing SKIN:  No rashes no nodules NEURO:  Cranial nerves II through XII grossly intact, motor grossly intact throughout PSYCH:  Cognitively intact, oriented to person place and time   EKG:  EKG is personally reviewed. 07/16/2022: Sinus rhythm.  Rate 70 bpm.  Short PR.  No pre-excitation 10/27/2022: EKG was not ordered.  Recent Labs: 07/16/2022: ALT 15; BUN 10; Creatinine, Ser 0.57; Hemoglobin 14.3; Magnesium 2.2; Platelets 297; Potassium 4.5; Sodium 138; TSH 1.460    Lipid Panel No results found for: "CHOL", "TRIG", "HDL", "CHOLHDL", "VLDL", "LDLCALC", "LDLDIRECT"   07/2022: Total cholesterol 276, triglycerides 108, HDL 51, LDL 156  Wt Readings from Last 3 Encounters:  10/27/22 172 lb (78 kg)  07/16/22 169 lb (76.7 kg)  12/06/15 173 lb (78.5 kg)     ASSESSMENT AND PLAN:  Syncope and collapse No recent episodes.  She has been stable.  Continue to hydrate.  BP remains on the low side.   Hyperlipidemia Lipids are elevated with PCP.  ASCVD 10-year risk is 0.9%.  Based on this we would not start a statin.  She will consider getting a calcium score.  Atypical chest pain She has atypical chest pain.  It only occurs in the setting of when she is feeling anxious.  She exercises and has no symptoms.  No ischemic evaluation indicated.  Time spent: 40 minutes-Greater than 50% of this time was spent in counseling, explanation of diagnosis, planning of further management, and coordination of care.    Current medicines are reviewed at length with the patient today.  The patient  concerns regarding medicines.  The following changes have been made:  none  Labs/ tests  ordered today include:   No orders of the defined types were placed in this encounter.    Disposition:   FU with Jomayra Novitsky C. Oval Linsey, MD, [facility] in 1 year.   I,Rachel Rivera,acting as a Education administrator for Skeet Latch, MD.,have documented all relevant documentation on the behalf of Skeet Latch, MD,as directed by  Skeet Latch, MD while in the presence of Skeet Latch, MD.  I, [place] Oval Linsey, MD have reviewed all documentation for this visit.  The documentation of the exam, diagnosis, procedures, and orders on 10/27/2022 are all accurate and complete.   Signed, Colvin Blatt C. Oval Linsey, MD, [facility]  10/27/2022 8:56 AM    [place]

## 2022-10-27 NOTE — Patient Instructions (Signed)
Medication Instructions:  Your physician recommends that you continue on your current medications as directed. Please refer to the Current Medication list given to you today.   *If you need a refill on your cardiac medications before your next appointment, please call your pharmacy*  Lab Work: none  Testing/Procedures: If you decide to have Calcium score call the office, will be $99 out of pocket  Follow-Up: At [facility], you and your health needs are our priority.  As part of our continuing mission to provide you with exceptional heart care, we have created designated Provider Care Teams.  These Care Teams include your primary Cardiologist (physician) and Advanced Practice Providers (APPs -  Physician Assistants and Nurse Practitioners) who all work together to provide you with the care you need, when you need it.  We recommend signing up for the patient portal called "MyChart".  Sign up information is provided on this After Visit Summary.  MyChart is used to connect with patients for Virtual Visits (Telemedicine).  Patients are able to view lab/test results, encounter notes, upcoming appointments, etc.  Non-urgent messages can be sent to your provider as well.   To learn more about what you can do with MyChart, go to [email or web address].    Your next appointment:   12 month(s)  The format for your next appointment:   In Person  Provider:   Skeet Latch, MD

## 2022-10-27 NOTE — Assessment & Plan Note (Addendum)
Lipids are elevated with PCP.  ASCVD 10-year risk is 0.9%.  Based on this we would not start a statin.  She will consider getting a calcium score.

## 2022-10-27 NOTE — Assessment & Plan Note (Signed)
She has atypical chest pain.  It only occurs in the setting of when she is feeling anxious.  She exercises and has no symptoms.  No ischemic evaluation indicated.

## 2022-10-27 NOTE — Assessment & Plan Note (Signed)
No recent episodes.  She has been stable.  Continue to hydrate.  BP remains on the low side.

## 2023-02-16 ENCOUNTER — Encounter (HOSPITAL_BASED_OUTPATIENT_CLINIC_OR_DEPARTMENT_OTHER): Payer: Self-pay | Admitting: Cardiovascular Disease

## 2023-02-16 DIAGNOSIS — E7800 Pure hypercholesterolemia, unspecified: Secondary | ICD-10-CM

## 2023-02-16 DIAGNOSIS — R0789 Other chest pain: Secondary | ICD-10-CM

## 2023-03-26 ENCOUNTER — Ambulatory Visit (HOSPITAL_BASED_OUTPATIENT_CLINIC_OR_DEPARTMENT_OTHER)
Admission: RE | Admit: 2023-03-26 | Discharge: 2023-03-26 | Disposition: A | Discharge status: Home / Self Care | Payer: BC Managed Care – PPO | Source: Ambulatory Visit | Attending: Cardiovascular Disease | Admitting: Cardiovascular Disease

## 2023-03-26 DIAGNOSIS — R0789 Other chest pain: Secondary | ICD-10-CM

## 2023-03-26 DIAGNOSIS — E7800 Pure hypercholesterolemia, unspecified: Secondary | ICD-10-CM
